# Patient Record
Sex: Male | Born: 1945 | Race: White | Hispanic: No | Marital: Married | Smoking: Never smoker
Health system: Southern US, Community
[De-identification: ages and names within clinical notes are randomized; demographics above are authoritative.]

## PROBLEM LIST (undated history)

## (undated) DIAGNOSIS — I10 Essential (primary) hypertension: Secondary | ICD-10-CM

## (undated) DIAGNOSIS — C434 Malignant melanoma of scalp and neck: Secondary | ICD-10-CM

## (undated) DIAGNOSIS — I639 Cerebral infarction, unspecified: Secondary | ICD-10-CM

## (undated) DIAGNOSIS — J45909 Unspecified asthma, uncomplicated: Secondary | ICD-10-CM

## (undated) DIAGNOSIS — E785 Hyperlipidemia, unspecified: Secondary | ICD-10-CM

## (undated) DIAGNOSIS — E119 Type 2 diabetes mellitus without complications: Secondary | ICD-10-CM

## (undated) DIAGNOSIS — E669 Obesity, unspecified: Secondary | ICD-10-CM

## (undated) HISTORY — PX: OTHER SURGICAL HISTORY: SHX169

## (undated) HISTORY — DX: Hyperlipidemia, unspecified: E78.5

## (undated) HISTORY — DX: Type 2 diabetes mellitus without complications: E11.9

## (undated) HISTORY — PX: CHOLECYSTECTOMY: SHX55

---

## 2004-01-11 ENCOUNTER — Ambulatory Visit: Payer: Self-pay | Admitting: Internal Medicine

## 2006-09-12 ENCOUNTER — Ambulatory Visit: Payer: Self-pay | Admitting: Internal Medicine

## 2012-01-23 ENCOUNTER — Encounter: Payer: Self-pay | Admitting: Nurse Practitioner

## 2012-01-23 ENCOUNTER — Encounter: Payer: Self-pay | Admitting: Cardiothoracic Surgery

## 2012-01-28 ENCOUNTER — Encounter: Payer: Self-pay | Admitting: Nurse Practitioner

## 2012-01-28 ENCOUNTER — Encounter: Payer: Self-pay | Admitting: Cardiothoracic Surgery

## 2012-07-15 ENCOUNTER — Ambulatory Visit: Payer: Self-pay | Admitting: Internal Medicine

## 2012-12-27 LAB — URINALYSIS, COMPLETE
Bilirubin,UR: NEGATIVE
Nitrite: NEGATIVE
Ph: 5 (ref 4.5–8.0)
RBC,UR: 4 /HPF (ref 0–5)
Squamous Epithelial: 3

## 2012-12-27 LAB — COMPREHENSIVE METABOLIC PANEL
Alkaline Phosphatase: 78 U/L (ref 50–136)
Calcium, Total: 8.9 mg/dL (ref 8.5–10.1)
Chloride: 105 mmol/L (ref 98–107)
Co2: 31 mmol/L (ref 21–32)
EGFR (African American): >60
EGFR (Non-African Amer.): >60
Glucose: 215 mg/dL — ABNORMAL HIGH (ref 65–99)
Osmolality: 286 (ref 275–301)
Potassium: 4.1 mmol/L (ref 3.5–5.1)
SGPT (ALT): 32 U/L (ref 12–78)
Sodium: 139 mmol/L (ref 136–145)
Total Protein: 6.7 g/dL (ref 6.4–8.2)

## 2012-12-27 LAB — CBC
HCT: 42.2 % (ref 40.0–52.0)
HGB: 14 g/dL (ref 13.0–18.0)
MCH: 31.2 pg (ref 26.0–34.0)
Platelet: 184 10*3/uL (ref 150–440)
RDW: 14.2 % (ref 11.5–14.5)
WBC: 8 10*3/uL (ref 3.8–10.6)

## 2012-12-28 ENCOUNTER — Inpatient Hospital Stay: Payer: Self-pay | Admitting: Internal Medicine

## 2012-12-30 LAB — CBC WITH DIFFERENTIAL/PLATELET
Basophil #: 0 10*3/uL (ref 0.0–0.1)
Basophil %: 0.5 %
Eosinophil %: 1.1 %
HCT: 42.3 % (ref 40.0–52.0)
HGB: 14.2 g/dL (ref 13.0–18.0)
Lymphocyte %: 21.9 %
MCH: 31.3 pg (ref 26.0–34.0)
MCHC: 33.6 g/dL (ref 32.0–36.0)
MCV: 93 fL (ref 80–100)
Monocyte #: 0.7 x10 3/mm (ref 0.2–1.0)
Monocyte %: 7.7 %
Neutrophil %: 68.8 %
Platelet: 179 10*3/uL (ref 150–440)
RBC: 4.53 10*6/uL (ref 4.40–5.90)
RDW: 13.7 % (ref 11.5–14.5)
WBC: 8.6 10*3/uL (ref 3.8–10.6)

## 2012-12-30 LAB — BASIC METABOLIC PANEL
BUN: 15 mg/dL (ref 7–18)
Chloride: 106 mmol/L (ref 98–107)
Creatinine: 0.83 mg/dL (ref 0.60–1.30)
EGFR (Non-African Amer.): >60
Glucose: 141 mg/dL — ABNORMAL HIGH (ref 65–99)
Osmolality: 279 (ref 275–301)
Potassium: 4 mmol/L (ref 3.5–5.1)
Sodium: 138 mmol/L (ref 136–145)

## 2012-12-30 LAB — HEMOGLOBIN A1C: Hemoglobin A1C: 9 % — ABNORMAL HIGH (ref 4.2–6.3)

## 2012-12-30 LAB — LIPID PANEL: HDL Cholesterol: 39 mg/dL — ABNORMAL LOW (ref 40–60)

## 2013-01-07 ENCOUNTER — Encounter: Payer: Self-pay | Admitting: Internal Medicine

## 2013-01-27 ENCOUNTER — Encounter: Payer: Self-pay | Admitting: Internal Medicine

## 2013-08-18 DIAGNOSIS — E669 Obesity, unspecified: Secondary | ICD-10-CM | POA: Insufficient documentation

## 2013-08-18 DIAGNOSIS — R0609 Other forms of dyspnea: Secondary | ICD-10-CM | POA: Insufficient documentation

## 2013-08-18 DIAGNOSIS — E119 Type 2 diabetes mellitus without complications: Secondary | ICD-10-CM | POA: Insufficient documentation

## 2013-08-18 DIAGNOSIS — E785 Hyperlipidemia, unspecified: Secondary | ICD-10-CM | POA: Insufficient documentation

## 2013-08-18 DIAGNOSIS — I10 Essential (primary) hypertension: Secondary | ICD-10-CM | POA: Insufficient documentation

## 2013-12-08 ENCOUNTER — Ambulatory Visit: Payer: Self-pay | Admitting: Gastroenterology

## 2013-12-10 LAB — PATHOLOGY REPORT

## 2014-03-03 DIAGNOSIS — E782 Mixed hyperlipidemia: Secondary | ICD-10-CM | POA: Diagnosis not present

## 2014-03-03 DIAGNOSIS — E1165 Type 2 diabetes mellitus with hyperglycemia: Secondary | ICD-10-CM | POA: Diagnosis not present

## 2014-03-03 DIAGNOSIS — I10 Essential (primary) hypertension: Secondary | ICD-10-CM | POA: Diagnosis not present

## 2014-03-03 DIAGNOSIS — Z79899 Other long term (current) drug therapy: Secondary | ICD-10-CM | POA: Diagnosis not present

## 2014-03-10 DIAGNOSIS — E1165 Type 2 diabetes mellitus with hyperglycemia: Secondary | ICD-10-CM | POA: Diagnosis not present

## 2014-03-10 DIAGNOSIS — I10 Essential (primary) hypertension: Secondary | ICD-10-CM | POA: Diagnosis not present

## 2014-03-10 DIAGNOSIS — E1142 Type 2 diabetes mellitus with diabetic polyneuropathy: Secondary | ICD-10-CM | POA: Diagnosis not present

## 2014-03-10 DIAGNOSIS — B351 Tinea unguium: Secondary | ICD-10-CM | POA: Diagnosis not present

## 2014-03-10 DIAGNOSIS — Z794 Long term (current) use of insulin: Secondary | ICD-10-CM | POA: Diagnosis not present

## 2014-05-23 DIAGNOSIS — J40 Bronchitis, not specified as acute or chronic: Secondary | ICD-10-CM | POA: Diagnosis not present

## 2014-05-23 DIAGNOSIS — R05 Cough: Secondary | ICD-10-CM | POA: Diagnosis not present

## 2014-05-23 DIAGNOSIS — J029 Acute pharyngitis, unspecified: Secondary | ICD-10-CM | POA: Diagnosis not present

## 2014-06-19 NOTE — H&P (Signed)
PATIENT NAME:  Philip Carpenter, Philip Carpenter MR#:  [id number] DATE OF BIRTH:  1945-08-31  DATE OF ADMISSION:  12/27/2012  PRIMARY CARE PHYSICIAN: Dr. Fulton Reek.   CHIEF COMPLAINT: Right-sided weakness and numbness.   HISTORY OF PRESENT ILLNESS: This is a 69 year old male who presents to the hospital due to some weakness in his right side and also some numbness. The patient said that he first developed some numbness of his right face and developed nasal congestion. He thought it was related to the infection that he may have caught from his granddaughter. He was out of town for work therefore, decided to come home early as he was not feeling well. He drove all the way back from [place], [place], got home and when he attempted to get out of the car, he was having difficulty walking and using his right leg. He said he had stopped on the way a couple times and had no trouble walking, but when he got home he had trouble walking. The patient then also developed some numbness of his right hand at lunch time today and had trouble grasping things. The patient was a bit concerned and therefore came to the ER for further evaluation. Presently, the patient still has some mild right arm numbness and facial numbness but the weakness in his lower extremities and the numbness have now resolved. He clinically feels much better. His CT head on admission in the ER, has been negative. There is still some concern for possible transient ischemic attack or underlying cerebrovascular accident and therefore, hospitalist services were contacted for further treatment and evaluation. The patient denies any headache. He denies any blurry vision. He denies any nausea, vomiting, chest pain, shortness of breath, diaphoresis, syncope, palpitations and no other associated symptoms presently.   REVIEW OF SYSTEMS: CONSTITUTIONAL: No documented fever. No weight gain, no weight loss.  EYES: No blurred or double vision.  ENT: No tinnitus. No postnasal  drip. No redness of the oropharynx.  RESPIRATORY: No cough, no wheeze, no hemoptysis, no dyspnea.  CARDIOVASCULAR: No chest pain. No orthopnea, no palpitations, no syncope.  GASTROINTESTINAL: No nausea, no vomiting, no diarrhea. No abdominal pain. No melena or hematochezia.  GENITOURINARY: No dysuria or hematuria.  ENDOCRINE: No polyuria or nocturia. No heat or cold intolerance.  HEMATOLOGIC: No anemia, no bruising, no bleeding.  INTEGUMENTARY: No rashes. No lesions.  MUSCULOSKELETAL: No arthritis. No swelling. No gout.  NEUROLOGIC: No numbness or tingling. No ataxia. No seizure-type activity. Positive right-sided numbness and weakness.  PSYCHIATRIC: No anxiety. No insomnia. No ADD.   PAST MEDICAL HISTORY: Consistent with diabetes, hypertension, hyperlipidemia, obesity.   ALLERGIES: No known drug allergies.   SOCIAL HISTORY: No smoking. No alcohol abuse. No illicit drug abuse. Lives at home with his wife.   FAMILY HISTORY: Both mother and father are deceased. Mother died from complications of dementia. Father died from complications of COPD.   CURRENT MEDICATIONS: As follows: Aspirin 81 mg daily, Altace 2.5 mg daily, Glucophage 1000 mg b.i.d., Glucotrol 10 mg b.i.d., Lantus 70 units at bedtime, NovoLog 20 units with lunch, 30 units with supper, Lipitor 10 mg daily and a multivitamin daily.   PHYSICAL EXAMINATION:  VITAL SIGNS:  Presently is as follows: Temperature 98.3, pulse 74, respirations 20, blood pressure 155/67, sats 93% on room air.  GENERAL: He is a pleasant-appearing male in no apparent distress.  HEAD, EYES, EARS, NOSE, THROAT EXAM: The patient is atraumatic, normocephalic. Extraocular muscles are intact. Pupils are equal and reactive to light. Sclerae  anicteric. No conjunctival injection. No pharyngeal erythema.  NECK: Supple. There is no jugular venous distention. No bruits, no lymphadenopathy, no thyromegaly.  HEART: Regular rate and rhythm. No murmurs. No rubs, no clicks.   LUNGS: Clear to auscultation bilaterally. No rales, no rhonchi, no wheezes.  ABDOMEN: Soft, flat, nontender, nondistended. Has good bowel sounds. No hepatosplenomegaly appreciated.  EXTREMITIES: No evidence of any cyanosis, clubbing, or peripheral edema. Has +2 pedal and radial pulses bilaterally.  NEUROLOGICAL: The patient is alert, awake, and oriented x 3 with no focal motor or sensory deficits appreciated bilaterally.  SKIN: Moist and warm with no rashes appreciated.  LYMPHATIC: There is no cervical or axillary lymphadenopathy.   LABORATORY AND IMAGING DATA: Serum glucose of 215, BUN 17, creatinine 1.1, sodium 139, potassium 4.1, chloride 105, bicarbonate 31. The patient's LFTs are within normal limits. White cell count 8.0, hemoglobin 14.0, hematocrit 42.2, platelet count 184. The patient did have a CT of the head done without contrast, which showed no acute intracranial abnormality, chronic ischemic change.   ASSESSMENT AND PLAN: This is a 69 year old male with a history of diabetes, hypertension, hyperlipidemia, obesity, who presents to the hospital due to right arm and leg numbness and weakness and also right facial numbness.   PROBLEM: 1.  Cerebrovascular accident/transient ischemic attack. This is a likely diagnosis given the patient's transient neurological symptoms which have now resolved. He does have significant risk factors given his diabetes and morbid obesity. A CT head on admission is negative, although I will get an MRI of the brain, get a carotid duplex and a 2-dimensional echocardiogram.  Continue him on aspirin. Continue his statin. Follow q. 4 hour neuro checks.  2.  Diabetes. I will continue his Lantus, NovoLog with meals and glipizide. Hold metformin for now.   3.  Hyperlipidemia: Continue atorvastatin. 4.  Hypertension. Continue Altace.  Tolerate some element of hypertension given the possibility of underlying stroke.   CODE STATUS: The patient is a FULL CODE.   He will  be transferred over to Dr. Fulton Reek service.   TIME SPENT: 45 minutes.     ____________________________ Belia Heman. Verdell Carmine, MD vjs:dp D: 12/27/2012 17:38:45 ET T: 12/27/2012 18:13:04 ET JOB#: [id number]  cc: Belia Heman. Verdell Carmine, MD, <Dictator> Henreitta Leber MD ELECTRONICALLY SIGNED 01/04/2013 10:28

## 2014-06-26 DIAGNOSIS — E1165 Type 2 diabetes mellitus with hyperglycemia: Secondary | ICD-10-CM | POA: Diagnosis not present

## 2014-07-02 DIAGNOSIS — E119 Type 2 diabetes mellitus without complications: Secondary | ICD-10-CM | POA: Diagnosis not present

## 2014-07-02 DIAGNOSIS — Z794 Long term (current) use of insulin: Secondary | ICD-10-CM | POA: Diagnosis not present

## 2014-07-02 DIAGNOSIS — E1165 Type 2 diabetes mellitus with hyperglycemia: Secondary | ICD-10-CM | POA: Diagnosis not present

## 2014-08-28 DIAGNOSIS — Z0000 Encounter for general adult medical examination without abnormal findings: Secondary | ICD-10-CM | POA: Diagnosis not present

## 2014-08-28 DIAGNOSIS — E782 Mixed hyperlipidemia: Secondary | ICD-10-CM | POA: Diagnosis not present

## 2014-08-28 DIAGNOSIS — E119 Type 2 diabetes mellitus without complications: Secondary | ICD-10-CM | POA: Diagnosis not present

## 2014-08-28 DIAGNOSIS — I10 Essential (primary) hypertension: Secondary | ICD-10-CM | POA: Diagnosis not present

## 2014-10-09 DIAGNOSIS — Z125 Encounter for screening for malignant neoplasm of prostate: Secondary | ICD-10-CM | POA: Diagnosis not present

## 2014-10-09 DIAGNOSIS — E782 Mixed hyperlipidemia: Secondary | ICD-10-CM | POA: Diagnosis not present

## 2014-10-09 DIAGNOSIS — Z79899 Other long term (current) drug therapy: Secondary | ICD-10-CM | POA: Diagnosis not present

## 2014-10-09 DIAGNOSIS — E119 Type 2 diabetes mellitus without complications: Secondary | ICD-10-CM | POA: Diagnosis not present

## 2014-10-09 DIAGNOSIS — E1165 Type 2 diabetes mellitus with hyperglycemia: Secondary | ICD-10-CM | POA: Diagnosis not present

## 2014-10-09 DIAGNOSIS — I10 Essential (primary) hypertension: Secondary | ICD-10-CM | POA: Diagnosis not present

## 2014-10-14 DIAGNOSIS — E119 Type 2 diabetes mellitus without complications: Secondary | ICD-10-CM | POA: Diagnosis not present

## 2014-10-14 DIAGNOSIS — E1165 Type 2 diabetes mellitus with hyperglycemia: Secondary | ICD-10-CM | POA: Diagnosis not present

## 2014-10-14 DIAGNOSIS — Z794 Long term (current) use of insulin: Secondary | ICD-10-CM | POA: Diagnosis not present

## 2015-01-15 DIAGNOSIS — E1165 Type 2 diabetes mellitus with hyperglycemia: Secondary | ICD-10-CM | POA: Diagnosis not present

## 2015-01-19 DIAGNOSIS — Z794 Long term (current) use of insulin: Secondary | ICD-10-CM | POA: Diagnosis not present

## 2015-01-19 DIAGNOSIS — I10 Essential (primary) hypertension: Secondary | ICD-10-CM | POA: Diagnosis not present

## 2015-01-19 DIAGNOSIS — E1165 Type 2 diabetes mellitus with hyperglycemia: Secondary | ICD-10-CM | POA: Diagnosis not present

## 2015-03-03 DIAGNOSIS — Z6841 Body Mass Index (BMI) 40.0 and over, adult: Secondary | ICD-10-CM | POA: Diagnosis not present

## 2015-03-03 DIAGNOSIS — Z79899 Other long term (current) drug therapy: Secondary | ICD-10-CM | POA: Diagnosis not present

## 2015-03-03 DIAGNOSIS — I10 Essential (primary) hypertension: Secondary | ICD-10-CM | POA: Diagnosis not present

## 2015-03-03 DIAGNOSIS — E782 Mixed hyperlipidemia: Secondary | ICD-10-CM | POA: Diagnosis not present

## 2015-03-11 DIAGNOSIS — E1142 Type 2 diabetes mellitus with diabetic polyneuropathy: Secondary | ICD-10-CM | POA: Diagnosis not present

## 2015-03-11 DIAGNOSIS — B351 Tinea unguium: Secondary | ICD-10-CM | POA: Diagnosis not present

## 2015-04-20 DIAGNOSIS — Z79899 Other long term (current) drug therapy: Secondary | ICD-10-CM | POA: Diagnosis not present

## 2015-04-20 DIAGNOSIS — E782 Mixed hyperlipidemia: Secondary | ICD-10-CM | POA: Diagnosis not present

## 2015-04-20 DIAGNOSIS — Z794 Long term (current) use of insulin: Secondary | ICD-10-CM | POA: Diagnosis not present

## 2015-04-20 DIAGNOSIS — E1165 Type 2 diabetes mellitus with hyperglycemia: Secondary | ICD-10-CM | POA: Diagnosis not present

## 2015-04-27 DIAGNOSIS — Z794 Long term (current) use of insulin: Secondary | ICD-10-CM

## 2015-04-27 DIAGNOSIS — Z6841 Body Mass Index (BMI) 40.0 and over, adult: Secondary | ICD-10-CM

## 2015-04-27 DIAGNOSIS — E1165 Type 2 diabetes mellitus with hyperglycemia: Secondary | ICD-10-CM | POA: Insufficient documentation

## 2015-04-27 DIAGNOSIS — I10 Essential (primary) hypertension: Secondary | ICD-10-CM | POA: Diagnosis not present

## 2015-08-16 DIAGNOSIS — Z794 Long term (current) use of insulin: Secondary | ICD-10-CM | POA: Diagnosis not present

## 2015-08-16 DIAGNOSIS — E1165 Type 2 diabetes mellitus with hyperglycemia: Secondary | ICD-10-CM | POA: Diagnosis not present

## 2015-08-17 DIAGNOSIS — Z794 Long term (current) use of insulin: Secondary | ICD-10-CM | POA: Diagnosis not present

## 2015-08-17 DIAGNOSIS — E1165 Type 2 diabetes mellitus with hyperglycemia: Secondary | ICD-10-CM | POA: Diagnosis not present

## 2015-08-17 DIAGNOSIS — E119 Type 2 diabetes mellitus without complications: Secondary | ICD-10-CM | POA: Diagnosis not present

## 2015-08-17 DIAGNOSIS — I10 Essential (primary) hypertension: Secondary | ICD-10-CM | POA: Diagnosis not present

## 2015-08-30 DIAGNOSIS — Z862 Personal history of diseases of the blood and blood-forming organs and certain disorders involving the immune mechanism: Secondary | ICD-10-CM | POA: Diagnosis not present

## 2015-08-30 DIAGNOSIS — E785 Hyperlipidemia, unspecified: Secondary | ICD-10-CM | POA: Diagnosis not present

## 2015-09-01 DIAGNOSIS — E113393 Type 2 diabetes mellitus with moderate nonproliferative diabetic retinopathy without macular edema, bilateral: Secondary | ICD-10-CM | POA: Diagnosis not present

## 2015-09-01 DIAGNOSIS — H5210 Myopia, unspecified eye: Secondary | ICD-10-CM | POA: Diagnosis not present

## 2015-09-01 DIAGNOSIS — H524 Presbyopia: Secondary | ICD-10-CM | POA: Diagnosis not present

## 2015-09-06 DIAGNOSIS — E785 Hyperlipidemia, unspecified: Secondary | ICD-10-CM | POA: Diagnosis not present

## 2015-09-06 DIAGNOSIS — E1142 Type 2 diabetes mellitus with diabetic polyneuropathy: Secondary | ICD-10-CM | POA: Diagnosis not present

## 2015-09-06 DIAGNOSIS — Z0000 Encounter for general adult medical examination without abnormal findings: Secondary | ICD-10-CM | POA: Diagnosis not present

## 2015-09-06 DIAGNOSIS — R0609 Other forms of dyspnea: Secondary | ICD-10-CM | POA: Diagnosis not present

## 2015-09-06 DIAGNOSIS — Z125 Encounter for screening for malignant neoplasm of prostate: Secondary | ICD-10-CM | POA: Diagnosis not present

## 2015-09-06 DIAGNOSIS — I10 Essential (primary) hypertension: Secondary | ICD-10-CM | POA: Diagnosis not present

## 2015-09-06 DIAGNOSIS — Z8673 Personal history of transient ischemic attack (TIA), and cerebral infarction without residual deficits: Secondary | ICD-10-CM | POA: Diagnosis not present

## 2015-09-06 DIAGNOSIS — R37 Sexual dysfunction, unspecified: Secondary | ICD-10-CM | POA: Diagnosis not present

## 2015-09-17 DIAGNOSIS — Z8673 Personal history of transient ischemic attack (TIA), and cerebral infarction without residual deficits: Secondary | ICD-10-CM | POA: Diagnosis not present

## 2015-09-17 DIAGNOSIS — R0609 Other forms of dyspnea: Secondary | ICD-10-CM | POA: Diagnosis not present

## 2015-11-10 DIAGNOSIS — Z794 Long term (current) use of insulin: Secondary | ICD-10-CM | POA: Diagnosis not present

## 2015-11-10 DIAGNOSIS — R37 Sexual dysfunction, unspecified: Secondary | ICD-10-CM | POA: Diagnosis not present

## 2015-11-10 DIAGNOSIS — Z125 Encounter for screening for malignant neoplasm of prostate: Secondary | ICD-10-CM | POA: Diagnosis not present

## 2015-11-10 DIAGNOSIS — E119 Type 2 diabetes mellitus without complications: Secondary | ICD-10-CM | POA: Diagnosis not present

## 2015-11-17 DIAGNOSIS — Z794 Long term (current) use of insulin: Secondary | ICD-10-CM | POA: Diagnosis not present

## 2015-11-17 DIAGNOSIS — I10 Essential (primary) hypertension: Secondary | ICD-10-CM | POA: Diagnosis not present

## 2015-11-17 DIAGNOSIS — E119 Type 2 diabetes mellitus without complications: Secondary | ICD-10-CM | POA: Diagnosis not present

## 2015-12-13 DIAGNOSIS — E349 Endocrine disorder, unspecified: Secondary | ICD-10-CM | POA: Diagnosis not present

## 2015-12-21 DIAGNOSIS — E291 Testicular hypofunction: Secondary | ICD-10-CM | POA: Diagnosis not present

## 2015-12-21 DIAGNOSIS — Z125 Encounter for screening for malignant neoplasm of prostate: Secondary | ICD-10-CM | POA: Diagnosis not present

## 2015-12-21 DIAGNOSIS — I10 Essential (primary) hypertension: Secondary | ICD-10-CM | POA: Diagnosis not present

## 2016-01-25 ENCOUNTER — Ambulatory Visit (INDEPENDENT_AMBULATORY_CARE_PROVIDER_SITE_OTHER): Payer: Commercial Managed Care - HMO | Admitting: Urology

## 2016-01-25 ENCOUNTER — Encounter: Payer: Self-pay | Admitting: Urology

## 2016-01-25 VITALS — BP 147/64 | HR 69 | Ht 69.0 in | Wt 314.7 lb

## 2016-01-25 DIAGNOSIS — E291 Testicular hypofunction: Secondary | ICD-10-CM | POA: Insufficient documentation

## 2016-01-25 DIAGNOSIS — R350 Frequency of micturition: Secondary | ICD-10-CM

## 2016-01-25 DIAGNOSIS — N401 Enlarged prostate with lower urinary tract symptoms: Secondary | ICD-10-CM | POA: Insufficient documentation

## 2016-01-25 DIAGNOSIS — Z125 Encounter for screening for malignant neoplasm of prostate: Secondary | ICD-10-CM | POA: Insufficient documentation

## 2016-01-25 MED ORDER — FINASTERIDE 5 MG PO TABS: 5.0000 mg | ORAL_TABLET | Freq: Every day | ORAL | 3 refills | Status: DC

## 2016-01-25 MED ORDER — TAMSULOSIN HCL 0.4 MG PO CAPS: 0.4000 mg | ORAL_CAPSULE | Freq: Every day | ORAL | 3 refills | Status: DC

## 2016-01-25 NOTE — Progress Notes (Signed)
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 He is morbidly obese diabetic and with clinical CHF with dyspnea on exertion. After consideration of risks and benefits he has decided to forego additional testing or supplementation.     2. Prostate cancer screening - No FHX prostate cancer 2017 - PSA 0.91 PCP labs / DRE 50gm smooth at age 70 ==> No further screening  3 - Erectile Dysfunction- pt with severe ED x years, unable to penetrate in over 10 years even with oral meds. He now is considering more aggressive hterapy. He is morbidly obese with large panus and cannot see his own penis which is buried in prepubic fat pad. He is considering possible trimix trial administered by his wife if she is amenable.  4 - Enlarged Prostate With Urinary Freqeuncy - long h/o irritative and obstructive symptoms improved on tamsulosin but still not at goal. DRE 50g smooth. Starting finasteride as well 2017.  PMH sig for morbid obesity, IDDM2 (A1c 9's).  His PCP is Fulton Reek MD with [facility].  Today "Philip Carpenter" is seen as new patient for above.    PMH: No past medical history on file.  Surgical History: No past surgical history on file.  Home Medications:    Medication List    as of 01/25/2016 10:31 AM   You have not been prescribed any medications.     Allergies: Allergies not on file  Family History: No family history on file.  Social History:  has no tobacco, alcohol, and drug history on file.    Review of Systems  Gastrointestinal (upper)  : Negative for upper GI symptoms  Gastrointestinal (lower) : Negative for lower GI symptoms  Constitutional : Negative for symptoms  Skin: Negative for  skin symptoms  Eyes: Negative for eye symptoms  Ear/Nose/Throat : Negative for Ear/Nose/Throat symptoms  Hematologic/Lymphatic: Negative for Hematologic/Lymphatic symptoms  Cardiovascular : Negative for cardiovascular symptoms  Respiratory : Negative for respiratory symptoms  Endocrine: Negative for endocrine symptoms  Musculoskeletal: Negative for musculoskeletal symptoms  Neurological: Negative for neurological symptoms  Psychologic: Negative for psychiatric symptoms    Physical Exam: There were no vitals taken for this visit.  Constitutional:  Alert and oriented, No acute distress. HEENT: [place] AT, moist mucus membranes.  Trachea midline, no masses. Cardiovascular: No clubbing, cyanosis, or edema. Respiratory: Normal respiratory effort, no increased work of breathing. GI: Abdomen is soft, nontender, nondistended, no abdominal masses. Massive truncal obesity.  GU: No CVA tenderness. Testes down w/o masses. 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 DRE 50gm smooth.  Skin: No rashes, bruises or suspicious lesions. Lymph: No cervical or inguinal adenopathy. Neurologic: Grossly intact, no focal deficits, moving all 4 extremities. Psychiatric: Normal mood and affect.  Laboratory Data: Lab Results  Component Value Date   WBC 8.6 12/30/2012   HGB 14.2 12/30/2012   HCT 42.3 12/30/2012   MCV 93 12/30/2012   PLT 179 12/30/2012    Lab Results  Component Value Date   CREATININE 0.83 12/30/2012    No results found for: PSA  No results found for: TESTOSTERONE  Lab Results  Component Value Date   HGBA1C 9.0 (H) 12/30/2012    Urinalysis    Component Value Date/Time  COLORURINE Yellow 12/27/2012 1636   APPEARANCEUR Hazy 12/27/2012 1636   LABSPEC 1.036 12/27/2012 1636   PHURINE 5.0 12/27/2012 1636   GLUCOSEU 150 mg/dL 12/27/2012 1636   HGBUR Negative 12/27/2012 1636   BILIRUBINUR Negative 12/27/2012 1636   KETONESUR Trace 12/27/2012 1636   PROTEINUR Negative 12/27/2012  1636   NITRITE Negative 12/27/2012 1636   LEUKOCYTESUR Trace 12/27/2012 1636    Pertinent Imaging: none  Assessment & Plan:  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 Also discussed that his obesity lowers tesosteorne by periperal conversion to estradiol via aromatase in excess fat that weight loss alone is safest way to increase testosterone.  Should he consider exogenous androgens would need baseline endocrine eval with AM fasting labs x 2 (before 10AM) with CMP, Hgb/Hct, PSA, T, Estradiol, PRL, LH, FSH and then total T again 1 week later and rediscuss.   He does not want furhter testing and I agree completely.   2. Prostate cancer screening - up to date this year and at age 65 with diabetes would not recommend furhter screening.  3. Erectile Dysfunction - discussed this is another manifestation of vascular disease and diabetes. He is refracotry to oral meds. Discussed that injection meds (trimix) may get him firm enough to penetrate but he would need his wife to administer as he cannot see his penis. He will talk it over with her and let us know if the wants med trial wit hin ofice teachin of his wife.  4 - Enlarged Prostate With Urinary Urgency - discssed role of additional medical therapy and he opts to add finasteride. I agree as this is cheap and safe. Discussed time course to improvement.  5 - RTC 1 year or sooner for trimix teaching.    Alexis Frock, [facility] [address], [place], [place] [address] [phone]

## 2016-02-09 DIAGNOSIS — E119 Type 2 diabetes mellitus without complications: Secondary | ICD-10-CM | POA: Diagnosis not present

## 2016-02-09 DIAGNOSIS — Z794 Long term (current) use of insulin: Secondary | ICD-10-CM | POA: Diagnosis not present

## 2016-02-16 DIAGNOSIS — Z794 Long term (current) use of insulin: Secondary | ICD-10-CM | POA: Diagnosis not present

## 2016-02-16 DIAGNOSIS — I10 Essential (primary) hypertension: Secondary | ICD-10-CM | POA: Diagnosis not present

## 2016-02-16 DIAGNOSIS — E119 Type 2 diabetes mellitus without complications: Secondary | ICD-10-CM | POA: Diagnosis not present

## 2016-03-03 DIAGNOSIS — E113393 Type 2 diabetes mellitus with moderate nonproliferative diabetic retinopathy without macular edema, bilateral: Secondary | ICD-10-CM | POA: Diagnosis not present

## 2016-03-14 DIAGNOSIS — Z125 Encounter for screening for malignant neoplasm of prostate: Secondary | ICD-10-CM | POA: Diagnosis not present

## 2016-03-14 DIAGNOSIS — E291 Testicular hypofunction: Secondary | ICD-10-CM | POA: Diagnosis not present

## 2016-03-21 DIAGNOSIS — I10 Essential (primary) hypertension: Secondary | ICD-10-CM | POA: Diagnosis not present

## 2016-03-21 DIAGNOSIS — Z6841 Body Mass Index (BMI) 40.0 and over, adult: Secondary | ICD-10-CM | POA: Diagnosis not present

## 2016-03-21 DIAGNOSIS — E785 Hyperlipidemia, unspecified: Secondary | ICD-10-CM | POA: Diagnosis not present

## 2016-03-21 DIAGNOSIS — Z79899 Other long term (current) drug therapy: Secondary | ICD-10-CM | POA: Diagnosis not present

## 2016-04-18 DIAGNOSIS — E785 Hyperlipidemia, unspecified: Secondary | ICD-10-CM | POA: Diagnosis not present

## 2016-04-18 DIAGNOSIS — Z794 Long term (current) use of insulin: Secondary | ICD-10-CM | POA: Diagnosis not present

## 2016-04-18 DIAGNOSIS — E119 Type 2 diabetes mellitus without complications: Secondary | ICD-10-CM | POA: Diagnosis not present

## 2016-04-18 DIAGNOSIS — Z79899 Other long term (current) drug therapy: Secondary | ICD-10-CM | POA: Diagnosis not present

## 2016-04-25 DIAGNOSIS — Z794 Long term (current) use of insulin: Secondary | ICD-10-CM | POA: Diagnosis not present

## 2016-04-25 DIAGNOSIS — E1165 Type 2 diabetes mellitus with hyperglycemia: Secondary | ICD-10-CM | POA: Diagnosis not present

## 2016-04-25 DIAGNOSIS — Z91120 Patient's intentional underdosing of medication regimen due to financial hardship: Secondary | ICD-10-CM | POA: Diagnosis not present

## 2016-08-23 DIAGNOSIS — R300 Dysuria: Secondary | ICD-10-CM | POA: Diagnosis not present

## 2016-08-23 DIAGNOSIS — N390 Urinary tract infection, site not specified: Secondary | ICD-10-CM | POA: Diagnosis not present

## 2016-09-07 DIAGNOSIS — H524 Presbyopia: Secondary | ICD-10-CM | POA: Diagnosis not present

## 2016-09-07 DIAGNOSIS — E113393 Type 2 diabetes mellitus with moderate nonproliferative diabetic retinopathy without macular edema, bilateral: Secondary | ICD-10-CM | POA: Diagnosis not present

## 2016-09-19 DIAGNOSIS — Z6841 Body Mass Index (BMI) 40.0 and over, adult: Secondary | ICD-10-CM | POA: Diagnosis not present

## 2016-09-19 DIAGNOSIS — I10 Essential (primary) hypertension: Secondary | ICD-10-CM | POA: Diagnosis not present

## 2016-09-19 DIAGNOSIS — E785 Hyperlipidemia, unspecified: Secondary | ICD-10-CM | POA: Diagnosis not present

## 2016-09-19 DIAGNOSIS — E1165 Type 2 diabetes mellitus with hyperglycemia: Secondary | ICD-10-CM | POA: Diagnosis not present

## 2016-09-19 DIAGNOSIS — Z79899 Other long term (current) drug therapy: Secondary | ICD-10-CM | POA: Diagnosis not present

## 2016-09-19 DIAGNOSIS — Z794 Long term (current) use of insulin: Secondary | ICD-10-CM | POA: Diagnosis not present

## 2016-09-19 DIAGNOSIS — Z0000 Encounter for general adult medical examination without abnormal findings: Secondary | ICD-10-CM | POA: Diagnosis not present

## 2016-11-17 DIAGNOSIS — Z23 Encounter for immunization: Secondary | ICD-10-CM | POA: Diagnosis not present

## 2016-12-21 DIAGNOSIS — E1165 Type 2 diabetes mellitus with hyperglycemia: Secondary | ICD-10-CM | POA: Diagnosis not present

## 2016-12-21 DIAGNOSIS — Z794 Long term (current) use of insulin: Secondary | ICD-10-CM | POA: Diagnosis not present

## 2016-12-21 DIAGNOSIS — Z91120 Patient's intentional underdosing of medication regimen due to financial hardship: Secondary | ICD-10-CM | POA: Diagnosis not present

## 2017-01-23 ENCOUNTER — Encounter: Payer: Self-pay | Admitting: Urology

## 2017-01-23 ENCOUNTER — Ambulatory Visit: Payer: Medicare HMO | Admitting: Urology

## 2017-01-23 VITALS — BP 146/77 | HR 89 | Ht 69.0 in | Wt 285.0 lb

## 2017-01-23 DIAGNOSIS — R350 Frequency of micturition: Secondary | ICD-10-CM

## 2017-01-23 DIAGNOSIS — N401 Enlarged prostate with lower urinary tract symptoms: Secondary | ICD-10-CM

## 2017-01-23 MED ORDER — FINASTERIDE 5 MG PO TABS: 5.0000 mg | ORAL_TABLET | Freq: Every day | ORAL | 3 refills | Status: AC

## 2017-01-23 NOTE — Progress Notes (Signed)
01/23/2017 2:25 PM   Philip Carpenter Sep 23, 1945 [id number]  Referring provider: Idelle Crouch, MD [facility] [place], [place] [address]  Chief Complaint  Patient presents with  . Follow-up  . Benign Prostatic Hypertrophy    HPI: 71 year old male presents for annual follow-up.  He saw Dr. Tresa Moore last year for BPH with lower urinary tract symptoms, possible hypogonadism and erectile dysfunction.  He had been on tamsulosin and finasteride was added.  He elected not to pursue androgen replacement.  He was considering intracavernosal injections for ED but elected to pursue. He states with the addition of finasteride he has noted less nocturia and less nighttime urgency. He currently gets up 1-2 times per night to void.  He feels his daytime urgency has worsened.  He does note onset of urgency with occasional episodes of urge incontinence when he goes from a sitting/supine position to standing.  He has done well at minimizing the symptoms with timed voiding.  He did have a PSA with Dr. Doy Hutching January 2018 which was 0.44.  He denies dysuria or gross hematuria.  He denies flank, abdominal, pelvic or scrotal pain.   PMH: Past Medical History:  Diagnosis Date  . Diabetes mellitus without complication ([place])   . Hyperlipidemia     Surgical History: History reviewed. No pertinent surgical history.  Home Medications:  Allergies as of 01/23/2017   No Known Allergies     Medication List        Accurate as of 01/23/17  2:25 PM. Always use your most recent med list.          atorvastatin 10 MG tablet Commonly known as:  LIPITOR Take by mouth.   finasteride 5 MG tablet Commonly known as:  PROSCAR Take 1 tablet (5 mg total) by mouth daily.   GOODSENSE ASPIRIN 325 MG tablet Generic drug:  aspirin Take by mouth.   insulin aspart 100 UNIT/ML FlexPen Commonly known as:  NOVOLOG Take before meals: 10 units at breakfast, 22 units at lunch and 32  units at supper   Insulin Degludec 200 UNIT/ML Sopn Inject into the skin.   TRESIBA FLEXTOUCH 200 UNIT/ML Sopn Generic drug:  Insulin Degludec Inject into the skin.   metFORMIN 1000 MG tablet Commonly known as:  GLUCOPHAGE TAKE 1 TABLET TWICE DAILY WITH MEALS   MULTI-VITAMINS Tabs Take by mouth.   ramipril 2.5 MG capsule Commonly known as:  ALTACE Take by mouth.   tamsulosin 0.4 MG Caps capsule Commonly known as:  FLOMAX Take 1 capsule (0.4 mg total) by mouth daily.       Allergies: No Known Allergies  Family History: Family History  Problem Relation Age of Onset  . Prostate cancer Neg Hx   . Bladder Cancer Neg Hx   . Kidney cancer Neg Hx     Social History:  reports that  has never smoked. he has never used smokeless tobacco. He reports that he does not drink alcohol or use drugs.  ROS: UROLOGY Frequent Urination?: No Hard to postpone urination?: Yes Burning/pain with urination?: No Get up at night to urinate?: No Leakage of urine?: Yes Urine stream starts and stops?: No Trouble starting stream?: No Do you have to strain to urinate?: No Blood in urine?: No Urinary tract infection?: No Sexually transmitted disease?: No Injury to kidneys or bladder?: No Painful intercourse?: No Weak stream?: No Erection problems?: Yes Penile pain?: No  Gastrointestinal Nausea?: No Vomiting?: No Indigestion/heartburn?: No Diarrhea?: No  Constipation?: No  Constitutional Fever: No Night sweats?: No Weight loss?: No Fatigue?: No  Skin Skin rash/lesions?: No Itching?: No  Eyes Blurred vision?: No Double vision?: No  Ears/Nose/Throat Sore throat?: No Sinus problems?: Yes  Hematologic/Lymphatic Swollen glands?: No Easy bruising?: No  Cardiovascular Leg swelling?: No Chest pain?: No  Respiratory Cough?: No Shortness of breath?: Yes  Endocrine Excessive thirst?: No  Musculoskeletal Back pain?: No Joint pain?: No  Neurological Headaches?:  No Dizziness?: Yes  Psychologic Depression?: No Anxiety?: No  Physical Exam: BP (!) 146/77   Pulse 89   Ht 5\' 9"  (1.753 m)   Wt 285 lb (129.3 kg)   BMI 42.09 kg/m   Constitutional:  Alert and oriented, No acute distress. HEENT: [place] AT, moist mucus membranes.  Trachea midline, no masses. Cardiovascular: No clubbing, cyanosis, or edema. Respiratory: Normal respiratory effort, no increased work of breathing. GI: Abdomen is soft, nontender, nondistended, no abdominal masses GU: No CVA tenderness.  Prostate 40 g, smooth without nodules Skin: No rashes, bruises or suspicious lesions. Lymph: No cervical or inguinal adenopathy. Neurologic: Grossly intact, no focal deficits, moving all 4 extremities. Psychiatric: Normal mood and affect.  Laboratory Data: Lab Results  Component Value Date   WBC 8.6 12/30/2012   HGB 14.2 12/30/2012   HCT 42.3 12/30/2012   MCV 93 12/30/2012   PLT 179 12/30/2012    Lab Results  Component Value Date   CREATININE 0.83 12/30/2012    Lab Results  Component Value Date   HGBA1C 9.0 (H) 12/30/2012      Assessment & Plan:    1.  BPH with lower urinary tract symptoms Worsening daytime storage related symptoms.  I did discuss addition of Myrbetriq however he is currently doing well with timed voiding and does not desire to take additional medication.  He has been on finasteride for 1 year.  I did recommend he try stopping the tamsulosin and may restart should he have worsening LUTS.  Continue annual follow-up.  Finasteride was refilled  2.  Prostate cancer screening We discussed current AUA guidelines recommending PSAs between the ages of 65 and 37.  His PSA has been low and the chances of developing clinically significant prostate cancer are unlikely.   Abbie Sons, [place] [address], [place], [place] [address] [phone]

## 2017-01-24 DIAGNOSIS — E113393 Type 2 diabetes mellitus with moderate nonproliferative diabetic retinopathy without macular edema, bilateral: Secondary | ICD-10-CM | POA: Diagnosis not present

## 2017-03-23 DIAGNOSIS — I10 Essential (primary) hypertension: Secondary | ICD-10-CM | POA: Diagnosis not present

## 2017-03-23 DIAGNOSIS — Z794 Long term (current) use of insulin: Secondary | ICD-10-CM | POA: Diagnosis not present

## 2017-03-23 DIAGNOSIS — E1165 Type 2 diabetes mellitus with hyperglycemia: Secondary | ICD-10-CM | POA: Diagnosis not present

## 2017-03-23 DIAGNOSIS — Z9114 Patient's other noncompliance with medication regimen: Secondary | ICD-10-CM | POA: Diagnosis not present

## 2017-04-12 DIAGNOSIS — Z125 Encounter for screening for malignant neoplasm of prostate: Secondary | ICD-10-CM | POA: Diagnosis not present

## 2017-04-12 DIAGNOSIS — I10 Essential (primary) hypertension: Secondary | ICD-10-CM | POA: Diagnosis not present

## 2017-04-12 DIAGNOSIS — Z79899 Other long term (current) drug therapy: Secondary | ICD-10-CM | POA: Diagnosis not present

## 2017-04-12 DIAGNOSIS — Z6841 Body Mass Index (BMI) 40.0 and over, adult: Secondary | ICD-10-CM | POA: Diagnosis not present

## 2017-04-12 DIAGNOSIS — E1142 Type 2 diabetes mellitus with diabetic polyneuropathy: Secondary | ICD-10-CM | POA: Diagnosis not present

## 2017-04-12 DIAGNOSIS — E785 Hyperlipidemia, unspecified: Secondary | ICD-10-CM | POA: Diagnosis not present

## 2017-04-16 DIAGNOSIS — Z794 Long term (current) use of insulin: Secondary | ICD-10-CM | POA: Diagnosis not present

## 2017-04-16 DIAGNOSIS — Z9114 Patient's other noncompliance with medication regimen: Secondary | ICD-10-CM | POA: Diagnosis not present

## 2017-04-16 DIAGNOSIS — E1165 Type 2 diabetes mellitus with hyperglycemia: Secondary | ICD-10-CM | POA: Diagnosis not present

## 2017-06-29 DIAGNOSIS — Z794 Long term (current) use of insulin: Secondary | ICD-10-CM | POA: Diagnosis not present

## 2017-06-29 DIAGNOSIS — Z9114 Patient's other noncompliance with medication regimen: Secondary | ICD-10-CM | POA: Diagnosis not present

## 2017-06-29 DIAGNOSIS — E1165 Type 2 diabetes mellitus with hyperglycemia: Secondary | ICD-10-CM | POA: Diagnosis not present

## 2017-07-26 DIAGNOSIS — M47816 Spondylosis without myelopathy or radiculopathy, lumbar region: Secondary | ICD-10-CM | POA: Diagnosis not present

## 2017-07-26 DIAGNOSIS — Z6841 Body Mass Index (BMI) 40.0 and over, adult: Secondary | ICD-10-CM | POA: Diagnosis not present

## 2017-07-26 DIAGNOSIS — M48061 Spinal stenosis, lumbar region without neurogenic claudication: Secondary | ICD-10-CM | POA: Diagnosis not present

## 2017-07-26 DIAGNOSIS — M5489 Other dorsalgia: Secondary | ICD-10-CM | POA: Diagnosis not present

## 2017-07-30 ENCOUNTER — Other Ambulatory Visit: Payer: Self-pay | Admitting: Internal Medicine

## 2017-07-30 DIAGNOSIS — M5489 Other dorsalgia: Secondary | ICD-10-CM

## 2017-07-31 ENCOUNTER — Other Ambulatory Visit: Payer: Self-pay | Admitting: Internal Medicine

## 2017-07-31 DIAGNOSIS — M5489 Other dorsalgia: Secondary | ICD-10-CM

## 2017-08-15 ENCOUNTER — Ambulatory Visit: Admission: RE | Admit: 2017-08-15 | Payer: Medicare HMO | Source: Ambulatory Visit

## 2017-10-02 DIAGNOSIS — E1165 Type 2 diabetes mellitus with hyperglycemia: Secondary | ICD-10-CM | POA: Diagnosis not present

## 2017-10-08 DIAGNOSIS — E1165 Type 2 diabetes mellitus with hyperglycemia: Secondary | ICD-10-CM | POA: Diagnosis not present

## 2017-12-06 DIAGNOSIS — I10 Essential (primary) hypertension: Secondary | ICD-10-CM | POA: Diagnosis not present

## 2017-12-06 DIAGNOSIS — Z6841 Body Mass Index (BMI) 40.0 and over, adult: Secondary | ICD-10-CM | POA: Diagnosis not present

## 2017-12-06 DIAGNOSIS — E785 Hyperlipidemia, unspecified: Secondary | ICD-10-CM | POA: Diagnosis not present

## 2017-12-06 DIAGNOSIS — Z23 Encounter for immunization: Secondary | ICD-10-CM | POA: Diagnosis not present

## 2017-12-06 DIAGNOSIS — E1165 Type 2 diabetes mellitus with hyperglycemia: Secondary | ICD-10-CM | POA: Diagnosis not present

## 2017-12-06 DIAGNOSIS — Z794 Long term (current) use of insulin: Secondary | ICD-10-CM | POA: Diagnosis not present

## 2017-12-06 DIAGNOSIS — Z79899 Other long term (current) drug therapy: Secondary | ICD-10-CM | POA: Diagnosis not present

## 2017-12-06 DIAGNOSIS — R55 Syncope and collapse: Secondary | ICD-10-CM | POA: Diagnosis not present

## 2017-12-07 DIAGNOSIS — Z86010 Personal history of colonic polyps: Secondary | ICD-10-CM | POA: Diagnosis not present

## 2017-12-10 ENCOUNTER — Other Ambulatory Visit: Payer: Self-pay | Admitting: Internal Medicine

## 2017-12-10 DIAGNOSIS — R55 Syncope and collapse: Secondary | ICD-10-CM

## 2017-12-24 ENCOUNTER — Ambulatory Visit
Admission: RE | Admit: 2017-12-24 | Discharge: 2017-12-24 | Disposition: A | Discharge status: Home / Self Care | Payer: Medicare HMO | Source: Ambulatory Visit | Attending: Internal Medicine | Admitting: Internal Medicine

## 2017-12-24 DIAGNOSIS — S0990XA Unspecified injury of head, initial encounter: Secondary | ICD-10-CM | POA: Diagnosis not present

## 2017-12-24 DIAGNOSIS — R55 Syncope and collapse: Secondary | ICD-10-CM | POA: Diagnosis not present

## 2018-01-11 DIAGNOSIS — Z79899 Other long term (current) drug therapy: Secondary | ICD-10-CM | POA: Diagnosis not present

## 2018-01-11 DIAGNOSIS — E1165 Type 2 diabetes mellitus with hyperglycemia: Secondary | ICD-10-CM | POA: Diagnosis not present

## 2018-01-14 DIAGNOSIS — E1169 Type 2 diabetes mellitus with other specified complication: Secondary | ICD-10-CM | POA: Diagnosis not present

## 2018-01-14 DIAGNOSIS — E669 Obesity, unspecified: Secondary | ICD-10-CM | POA: Diagnosis not present

## 2018-01-14 DIAGNOSIS — E1165 Type 2 diabetes mellitus with hyperglycemia: Secondary | ICD-10-CM | POA: Diagnosis not present

## 2018-01-22 ENCOUNTER — Encounter: Admission: RE | Payer: Self-pay | Source: Ambulatory Visit

## 2018-01-22 ENCOUNTER — Ambulatory Visit: Admission: RE | Admit: 2018-01-22 | Payer: Medicare HMO | Source: Ambulatory Visit | Admitting: Internal Medicine

## 2018-01-22 SURGERY — COLONOSCOPY WITH PROPOFOL
Anesthesia: General

## 2018-01-23 ENCOUNTER — Ambulatory Visit: Payer: Self-pay | Admitting: Urology

## 2018-01-28 ENCOUNTER — Encounter: Payer: Self-pay | Admitting: *Deleted

## 2018-01-29 ENCOUNTER — Other Ambulatory Visit: Payer: Self-pay

## 2018-01-29 ENCOUNTER — Ambulatory Visit: Payer: Medicare HMO | Admitting: Anesthesiology

## 2018-01-29 ENCOUNTER — Encounter
Admission: RE | Disposition: A | Discharge status: Home / Self Care | Payer: Self-pay | Source: Ambulatory Visit | Attending: Internal Medicine

## 2018-01-29 ENCOUNTER — Ambulatory Visit
Admission: RE | Admit: 2018-01-29 | Discharge: 2018-01-29 | Disposition: A | Discharge status: Home / Self Care | Payer: Medicare HMO | Source: Ambulatory Visit | Attending: Internal Medicine | Admitting: Internal Medicine

## 2018-01-29 DIAGNOSIS — K640 First degree hemorrhoids: Secondary | ICD-10-CM | POA: Diagnosis not present

## 2018-01-29 DIAGNOSIS — Z7982 Long term (current) use of aspirin: Secondary | ICD-10-CM | POA: Insufficient documentation

## 2018-01-29 DIAGNOSIS — Z86010 Personal history of colonic polyps: Secondary | ICD-10-CM | POA: Diagnosis not present

## 2018-01-29 DIAGNOSIS — E1165 Type 2 diabetes mellitus with hyperglycemia: Secondary | ICD-10-CM | POA: Diagnosis not present

## 2018-01-29 DIAGNOSIS — E785 Hyperlipidemia, unspecified: Secondary | ICD-10-CM | POA: Diagnosis not present

## 2018-01-29 DIAGNOSIS — D124 Benign neoplasm of descending colon: Secondary | ICD-10-CM | POA: Diagnosis not present

## 2018-01-29 DIAGNOSIS — D126 Benign neoplasm of colon, unspecified: Secondary | ICD-10-CM | POA: Diagnosis not present

## 2018-01-29 DIAGNOSIS — K5730 Diverticulosis of large intestine without perforation or abscess without bleeding: Secondary | ICD-10-CM | POA: Insufficient documentation

## 2018-01-29 DIAGNOSIS — Z794 Long term (current) use of insulin: Secondary | ICD-10-CM | POA: Insufficient documentation

## 2018-01-29 DIAGNOSIS — E119 Type 2 diabetes mellitus without complications: Secondary | ICD-10-CM | POA: Insufficient documentation

## 2018-01-29 DIAGNOSIS — K635 Polyp of colon: Secondary | ICD-10-CM | POA: Diagnosis not present

## 2018-01-29 DIAGNOSIS — K648 Other hemorrhoids: Secondary | ICD-10-CM | POA: Diagnosis not present

## 2018-01-29 DIAGNOSIS — Z6841 Body Mass Index (BMI) 40.0 and over, adult: Secondary | ICD-10-CM | POA: Insufficient documentation

## 2018-01-29 DIAGNOSIS — D122 Benign neoplasm of ascending colon: Secondary | ICD-10-CM | POA: Diagnosis not present

## 2018-01-29 DIAGNOSIS — I10 Essential (primary) hypertension: Secondary | ICD-10-CM | POA: Diagnosis not present

## 2018-01-29 DIAGNOSIS — Z1211 Encounter for screening for malignant neoplasm of colon: Secondary | ICD-10-CM | POA: Insufficient documentation

## 2018-01-29 DIAGNOSIS — K5790 Diverticulosis of intestine, part unspecified, without perforation or abscess without bleeding: Secondary | ICD-10-CM | POA: Diagnosis not present

## 2018-01-29 HISTORY — PX: COLONOSCOPY WITH PROPOFOL: SHX5780

## 2018-01-29 LAB — GLUCOSE, CAPILLARY: Glucose-Capillary: 291 mg/dL — ABNORMAL HIGH (ref 70–99)

## 2018-01-29 SURGERY — COLONOSCOPY WITH PROPOFOL
Anesthesia: General

## 2018-01-29 MED ORDER — PROPOFOL 10 MG/ML IV BOLUS
INTRAVENOUS | Status: DC | PRN
  Administered 2018-01-29 (×2): 50 mg via INTRAVENOUS

## 2018-01-29 MED ORDER — PROPOFOL 500 MG/50ML IV EMUL
INTRAVENOUS | Status: AC
  Filled 2018-01-29: qty 50

## 2018-01-29 MED ORDER — PROPOFOL 500 MG/50ML IV EMUL
INTRAVENOUS | Status: DC | PRN
  Administered 2018-01-29: 75 ug/kg/min via INTRAVENOUS

## 2018-01-29 MED ORDER — SODIUM CHLORIDE 0.9 % IV SOLN
INTRAVENOUS | Status: DC
  Administered 2018-01-29: 07:00:00 via INTRAVENOUS

## 2018-01-29 NOTE — Transfer of Care (Signed)
Immediate Anesthesia Transfer of Care Note  Patient: Philip Carpenter.  Procedure(s) Performed: COLONOSCOPY WITH PROPOFOL (N/A )  Patient Location: PACU  Anesthesia Type:General  Level of Consciousness: awake and alert   Airway & Oxygen Therapy: Patient Spontanous Breathing  Post-op Assessment: Report given to RN  Post vital signs: Reviewed and stable  Last Vitals:  Vitals Value Taken Time  BP 157/68 01/29/2018  8:30 AM  Temp 37 C 01/29/2018  8:30 AM  Pulse 88 01/29/2018  8:30 AM  Resp 16 01/29/2018  8:30 AM  SpO2 99 % 01/29/2018  8:30 AM    Last Pain:  Vitals:   01/29/18 0829  TempSrc: Tympanic  PainSc: 0-No pain         Complications: No apparent anesthesia complications

## 2018-01-29 NOTE — Op Note (Signed)
[facility] Gastroenterology Patient Name: Philip Carpenter Procedure Date: 01/29/2018 7:30 AM MRN: [id number] Account #: [id number] Date of Birth: 1946/01/09 Admit Type: Outpatient Age: 72 Room: [facility] ENDO ROOM 2 Gender: Male Note Status: Finalized Procedure:            Colonoscopy Indications:          High risk colon cancer surveillance: Personal history                        of colonic polyps Providers:            Benay Pike. Alice Reichert MD, MD Referring MD:         Leonie Douglas. Doy Hutching, MD (Referring MD) Medicines:            Propofol per Anesthesia Complications:        No immediate complications. Procedure:            Pre-Anesthesia Assessment:                       - The risks and benefits of the procedure and the                        sedation options and risks were discussed with the                        patient. All questions were answered and informed                        consent was obtained.                       - Patient identification and proposed procedure were                        verified prior to the procedure by the nurse. The                        procedure was verified in the procedure room.                       - ASA Grade Assessment: III - A patient with severe                        systemic disease.                       - After reviewing the risks and benefits, the patient                        was deemed in satisfactory condition to undergo the                        procedure.                       After obtaining informed consent, the colonoscope was                        passed under direct vision. Throughout the procedure,  the patient's blood pressure, pulse, and oxygen                        saturations were monitored continuously. The                        Colonoscope was introduced through the anus and                        advanced to the the cecum, identified by appendiceal                         orifice and ileocecal valve. The colonoscopy was                        performed without difficulty. The patient tolerated the                        procedure well. The quality of the bowel preparation                        was good. The ileocecal valve, appendiceal orifice, and                        rectum were photographed. Findings:      The perianal and digital rectal examinations were normal. Pertinent       negatives include normal sphincter tone and normal prostate (size,       shape, and consistency).      Many small-mouthed diverticula were found in the sigmoid colon.      Two sessile polyps were found in the ascending colon. The polyps were 3       to 4 mm in size. These polyps were removed with a jumbo cold forceps.       Resection and retrieval were complete.      A 6 mm polyp was found in the ascending colon. The polyp was       semi-pedunculated. The polyp was removed with a cold snare. Polyp       resection was incomplete, and the resected tissue was not retrieved.      A 4 mm polyp was found in the descending colon. The polyp was sessile.       The polyp was removed with a cold biopsy forceps. Resection and       retrieval were complete.      The exam was otherwise without abnormality.      Non-bleeding internal hemorrhoids were found during retroflexion. The       hemorrhoids were Grade I (internal hemorrhoids that do not prolapse). Impression:           - Diverticulosis in the sigmoid colon.                       - Two 3 to 4 mm polyps in the ascending colon, removed                        with a jumbo cold forceps. Resected and retrieved.                       - One 6 mm polyp in the ascending colon, removed with a  cold snare. Incomplete resection. Resected tissue not                        retrieved.                       - One 4 mm polyp in the descending colon, removed with                        a cold biopsy forceps. Resected and  retrieved.                       - The examination was otherwise normal. Recommendation:       - Patient has a contact number available for                        emergencies. The signs and symptoms of potential                        delayed complications were discussed with the patient.                        Return to normal activities tomorrow. Written discharge                        instructions were provided to the patient.                       - Resume previous diet.                       - Continue present medications.                       - Await pathology results.                       - Repeat colonoscopy is recommended for surveillance.                        The colonoscopy date will be determined after pathology                        results from today's exam become available for review.                       - Return to GI office PRN.                       - The findings and recommendations were discussed with                        the patient and their family. Procedure Code(s):    --- Professional ---                       [phone], Colonoscopy, flexible; with removal of tumor(s),                        polyp(s), or other lesion(s) by snare technique  15176, 60, Colonoscopy, flexible; with biopsy, single                        or multiple Diagnosis Code(s):    --- Professional ---                       K57.30, Diverticulosis of large intestine without                        perforation or abscess without bleeding                       D12.4, Benign neoplasm of descending colon                       D12.2, Benign neoplasm of ascending colon                       Z86.010, Personal history of colonic polyps CPT copyright 2018 American [employer] Association. All rights reserved. The codes documented in this report are preliminary and upon coder review may  be revised to meet current compliance requirements. Efrain Sella MD, MD 01/29/2018 8:32:27  AM This report has been signed electronically. Number of Addenda: 0 Note Initiated On: 01/29/2018 7:30 AM Scope Withdrawal Time: 0 hours 10 minutes 21 seconds  Total Procedure Duration: 0 hours 13 minutes 42 seconds       [facility]

## 2018-01-29 NOTE — Interval H&P Note (Signed)
History and Physical Interval Note:  01/29/2018 8:06 AM  Philip Carpenter.  has presented today for surgery, with the diagnosis of PRS HX COLON POLYPS  The various methods of treatment have been discussed with the patient and family. After consideration of risks, benefits and other options for treatment, the patient has consented to  Procedure(s): COLONOSCOPY WITH PROPOFOL (N/A) as a surgical intervention .  The patient's history has been reviewed, patient examined, no change in status, stable for surgery.  I have reviewed the patient's chart and labs.  Questions were answered to the patient's satisfaction.     [place], [place]

## 2018-01-29 NOTE — H&P (Signed)
Outpatient short stay form Pre-procedure 01/29/2018 8:05 AM Naiyana Barbian K. Alice Reichert, M.D.  Primary Physician: Fulton Reek, M.D.  Reason for visit: Personal hx of colon polyps.  History of present illness:                           Patient presents for colonoscopy for a personal hx of colon polyps. The patient denies abdominal pain, abnormal weight loss or rectal bleeding.     Current Facility-Administered Medications:  .  0.9 %  sodium chloride infusion, , Intravenous, Continuous, [place], Benay Pike, MD, Last Rate: 20 mL/hr at 01/29/18 0725  Medications Prior to Admission  Medication Sig Dispense Refill Last Dose  . finasteride (PROSCAR) 5 MG tablet Take 5 mg by mouth daily.     Marland Kitchen aspirin (GOODSENSE ASPIRIN) 325 MG tablet Take by mouth.   01/27/2018  . atorvastatin (LIPITOR) 10 MG tablet Take by mouth.   01/27/2018  . insulin aspart (NOVOLOG) 100 UNIT/ML FlexPen Take before meals: 10 units at breakfast, 22 units at lunch and 32 units at supper   01/27/2018 at 1930  . Insulin Degludec 200 UNIT/ML SOPN Inject 82 Units into the skin.    01/27/2018  . metFORMIN (GLUCOPHAGE) 1000 MG tablet TAKE 1 TABLET TWICE DAILY WITH MEALS   Taking  . Multiple Vitamin (MULTI-VITAMINS) TABS Take by mouth.   01/27/2018  . ramipril (ALTACE) 2.5 MG capsule Take by mouth.   Taking  . tamsulosin (FLOMAX) 0.4 MG CAPS capsule Take 1 capsule (0.4 mg total) by mouth daily. (Patient not taking: Reported on 01/29/2018) 90 capsule 3 Not Taking at Unknown time     No Known Allergies   Past Medical History:  Diagnosis Date  . Diabetes mellitus without complication ([place])   . Hyperlipidemia     Review of systems:  Otherwise negative.    Physical Exam  Gen: Alert, oriented. Appears stated age.  HEENT: [place]/AT. PERRLA. Lungs: CTA, no wheezes. CV: RR nl S1, S2. Abd: soft, benign, no masses. BS+ Ext: No edema. Pulses 2+    Planned procedures: Proceed with colonoscopy. The patient understands the nature of the  planned procedure, indications, risks, alternatives and potential complications including but not limited to bleeding, infection, perforation, damage to internal organs and possible oversedation/side effects from anesthesia. The patient agrees and gives consent to proceed.  Please refer to procedure notes for findings, recommendations and patient disposition/instructions.     Cordarrell Sane K. Alice Reichert, M.D. Gastroenterology 01/29/2018  8:05 AM

## 2018-01-29 NOTE — OR Nursing (Signed)
Dr. Rosey Bath notified of FBG= 291, no treatment ordered, OK to proceed.

## 2018-01-29 NOTE — Anesthesia Preprocedure Evaluation (Signed)
Anesthesia Evaluation  Patient identified by MRN, date of birth, ID band Patient awake    Reviewed: Allergy & Precautions, H&P , NPO status , Patient's Chart, lab work & pertinent test results, reviewed documented beta blocker date and time   History of Anesthesia Complications Negative for: history of anesthetic complications  Airway Mallampati: III  TM Distance: >3 FB Neck ROM: full    Dental  (+) Dental Advidsory Given, Chipped, Teeth Intact   Pulmonary neg shortness of breath, asthma , neg sleep apnea, neg recent URI,           Cardiovascular Exercise Tolerance: Good negative cardio ROS       Neuro/Psych negative neurological ROS  negative psych ROS   GI/Hepatic negative GI ROS, Neg liver ROS,   Endo/Other  diabetesMorbid obesity  Renal/GU negative Renal ROS  negative genitourinary   Musculoskeletal   Abdominal   Peds  Hematology negative hematology ROS (+)   Anesthesia Other Findings Past Medical History: No date: Diabetes mellitus without complication (HCC) No date: Hyperlipidemia   Reproductive/Obstetrics negative OB ROS                             Anesthesia Physical Anesthesia Plan  ASA: III  Anesthesia Plan: General   Post-op Pain Management:    Induction: Intravenous  PONV Risk Score and Plan: 2 and Propofol infusion and TIVA  Airway Management Planned: Nasal Cannula  Additional Equipment:   Intra-op Plan:   Post-operative Plan:   Informed Consent: I have reviewed the patients History and Physical, chart, labs and discussed the procedure including the risks, benefits and alternatives for the proposed anesthesia with the patient or authorized representative who has indicated his/her understanding and acceptance.   Dental Advisory Given  Plan Discussed with: Anesthesiologist, CRNA and Surgeon  Anesthesia Plan Comments:         Anesthesia Quick  Evaluation

## 2018-01-29 NOTE — Anesthesia Postprocedure Evaluation (Signed)
Anesthesia Post Note  Patient: Juandavid Dallman.  Procedure(s) Performed: COLONOSCOPY WITH PROPOFOL (N/A )  Patient location during evaluation: Endoscopy Anesthesia Type: General Level of consciousness: awake and alert Pain management: pain level controlled Vital Signs Assessment: post-procedure vital signs reviewed and stable Respiratory status: spontaneous breathing, nonlabored ventilation, respiratory function stable and patient connected to nasal cannula oxygen Cardiovascular status: blood pressure returned to baseline and stable Postop Assessment: no apparent nausea or vomiting Anesthetic complications: no     Last Vitals:  Vitals:   01/29/18 0849 01/29/18 0859  BP: 125/66 127/69  Pulse: 65 (!) 59  Resp: 12 14  Temp:    SpO2: 96% 97%    Last Pain:  Vitals:   01/29/18 0859  TempSrc:   PainSc: 0-No pain                 Martha Clan

## 2018-01-29 NOTE — Anesthesia Post-op Follow-up Note (Signed)
Anesthesia QCDR form completed.        

## 2018-01-30 ENCOUNTER — Encounter: Payer: Self-pay | Admitting: Internal Medicine

## 2018-01-31 LAB — SURGICAL PATHOLOGY

## 2018-02-04 ENCOUNTER — Telehealth: Payer: Self-pay | Admitting: Urology

## 2018-02-04 MED ORDER — FINASTERIDE 5 MG PO TABS: 5.0000 mg | ORAL_TABLET | Freq: Every day | ORAL | 0 refills | Status: DC

## 2018-02-04 NOTE — Telephone Encounter (Signed)
Pt returned call and I spoke with pt and he states he is taking medication but is about to run out, informed him that I can refill enough to get him to his appt. Pt was ok with this and verified pharmacy and rx was sent. Nothing further is needed

## 2018-02-04 NOTE — Telephone Encounter (Signed)
Attempted to reach pt to verify if he is taking his Finasteride (proscar) due to a refill request from [facility] but according to Epic pt reported he was not taking.

## 2018-02-04 NOTE — Addendum Note (Signed)
Addended by: Benson Setting L on: 02/04/2018 02:26 PM   Modules accepted: Orders

## 2018-03-12 ENCOUNTER — Other Ambulatory Visit: Payer: Self-pay

## 2018-03-12 MED ORDER — FINASTERIDE 5 MG PO TABS: 5.0000 mg | ORAL_TABLET | Freq: Every day | ORAL | 0 refills | Status: DC

## 2018-03-12 NOTE — Telephone Encounter (Signed)
Sent in one refill for Finasteride to [facility]  Patient has appt on 03/22/18 with Dr Bernardo Heater

## 2018-03-22 ENCOUNTER — Ambulatory Visit: Payer: Medicare HMO | Admitting: Urology

## 2018-03-22 ENCOUNTER — Encounter: Payer: Self-pay | Admitting: Urology

## 2018-04-09 ENCOUNTER — Ambulatory Visit: Payer: Medicare HMO | Admitting: Urology

## 2018-04-09 ENCOUNTER — Encounter: Payer: Self-pay | Admitting: Urology

## 2018-04-09 VITALS — BP 118/75 | HR 83 | Ht 70.0 in | Wt 280.0 lb

## 2018-04-09 DIAGNOSIS — R350 Frequency of micturition: Secondary | ICD-10-CM | POA: Diagnosis not present

## 2018-04-09 DIAGNOSIS — N401 Enlarged prostate with lower urinary tract symptoms: Secondary | ICD-10-CM | POA: Diagnosis not present

## 2018-04-09 LAB — BLADDER SCAN AMB NON-IMAGING

## 2018-04-09 NOTE — Progress Notes (Signed)
04/09/2018 3:39 PM   Philip Carpenter 05-Oct-1945 [id number]  Referring provider: Idelle Crouch, MD [place] [facility] [place], [place] [address]  Chief Complaint  Patient presents with  . Benign Prostatic Hypertrophy    HPI: 73 year old male presents for annual follow-up of BPH with lower urinary tract symptoms.  At his visit last year he was doing fairly well on combination therapy with finasteride and tamsulosin.  Over the last few months he has noted worsening urinary frequency and nocturia every 3 hours at night.  Denies dysuria or gross hematuria.  Denies flank, abdominal, pelvic or scrotal pain.  IPSS completed today was 12/4.  In reviewing his medications he states someone told him to stop the tamsulosin but he is not sure why.  He denied orthostatic changes and thinks he was tolerating it well.  This does coincide with the worsening of his symptoms.  He did have a PSA performed by his PCP in February 2019 which was 0.34.  PMH: Past Medical History:  Diagnosis Date  . Diabetes mellitus without complication ([place])   . Hyperlipidemia     Surgical History: Past Surgical History:  Procedure Laterality Date  . colonoscopy    . COLONOSCOPY WITH PROPOFOL N/A 01/29/2018   Procedure: COLONOSCOPY WITH PROPOFOL;  Surgeon: [place], Benay Pike, MD;  Location: [facility] ENDOSCOPY;  Service: Gastroenterology;  Laterality: N/A;    Home Medications:  Allergies as of 04/09/2018   No Known Allergies     Medication List       Accurate as of April 09, 2018  3:39 PM. Always use your most recent med list.        albuterol 108 (90 Base) MCG/ACT inhaler Commonly known as:  PROVENTIL HFA;VENTOLIN HFA Inhale into the lungs.   atorvastatin 10 MG tablet Commonly known as:  LIPITOR Take by mouth.   finasteride 5 MG tablet Commonly known as:  PROSCAR Take 1 tablet (5 mg total) by mouth daily.   GOODSENSE ASPIRIN 325 MG tablet Generic drug:  aspirin Take by  mouth.   insulin aspart 100 UNIT/ML FlexPen Commonly known as:  NOVOLOG Take before meals: 10 units at breakfast, 22 units at lunch and 32 units at supper   Insulin Degludec 200 UNIT/ML Sopn Inject 82 Units into the skin.   metFORMIN 1000 MG tablet Commonly known as:  GLUCOPHAGE TAKE 1 TABLET TWICE DAILY WITH MEALS   MULTI-VITAMINS Tabs Take by mouth.   ramipril 2.5 MG capsule Commonly known as:  ALTACE Take by mouth.   tamsulosin 0.4 MG Caps capsule Commonly known as:  FLOMAX Take 1 capsule (0.4 mg total) by mouth daily.       Allergies: No Known Allergies  Family History: Family History  Problem Relation Age of Onset  . Prostate cancer Neg Hx   . Bladder Cancer Neg Hx   . Kidney cancer Neg Hx     Social History:  reports that he has never smoked. He has never used smokeless tobacco. He reports that he does not drink alcohol or use drugs.  ROS: UROLOGY Frequent Urination?: Yes Hard to postpone urination?: No Burning/pain with urination?: No Get up at night to urinate?: Yes Leakage of urine?: Yes Urine stream starts and stops?: Yes Trouble starting stream?: No Do you have to strain to urinate?: No Blood in urine?: No Urinary tract infection?: No Sexually transmitted disease?: No Injury to kidneys or bladder?: No Painful intercourse?: No Weak stream?: No Erection problems?: Yes Penile pain?:  No  Gastrointestinal Nausea?: No Vomiting?: No Indigestion/heartburn?: No Diarrhea?: No Constipation?: No  Constitutional Fever: No Night sweats?: No Weight loss?: No Fatigue?: No  Skin Skin rash/lesions?: No Itching?: No  Eyes Blurred vision?: No Double vision?: No  Ears/Nose/Throat Sore throat?: No Sinus problems?: No  Hematologic/Lymphatic Swollen glands?: No Easy bruising?: No  Cardiovascular Leg swelling?: No Chest pain?: No  Respiratory Cough?: No Shortness of breath?: No  Endocrine Excessive thirst?: No  Musculoskeletal Back  pain?: No Joint pain?: No  Neurological Headaches?: No Dizziness?: No  Psychologic Depression?: No Anxiety?: No  Physical Exam: BP 118/75 (BP Location: Left Arm, Patient Position: Sitting)   Pulse 83   Ht 5\' 10"  (1.778 m)   Wt 280 lb (127 kg)   BMI 40.18 kg/m   Constitutional:  Alert and oriented, No acute distress. HEENT: [place] AT, moist mucus membranes.  Trachea midline, no masses. Cardiovascular: No clubbing, cyanosis, or edema. Respiratory: Normal respiratory effort, no increased work of breathing. GI: Abdomen is soft, nontender, nondistended, no abdominal masses GU: No CVA tenderness Lymph: No cervical or inguinal lymphadenopathy. Skin: No rashes, bruises or suspicious lesions. Neurologic: Grossly intact, no focal deficits, moving all 4 extremities. Psychiatric: Normal mood and affect.   Assessment & Plan:    1. Benign prostatic hyperplasia with urinary frequency His voiding symptoms have worsened however he has discontinued the tamsulosin.  He was not having side effects and I recommend he restart.  If his storage related symptoms do not improve after 1 month back on the tamsulosin he was instructed to call back and will give a trial of Myrbetriq.  PVR by bladder scan today was 0 mL.  Finasteride and tamsulosin were refilled.  Abbie Sons, [place] [address] [place], [place] [address] [phone]

## 2018-04-10 ENCOUNTER — Encounter: Payer: Self-pay | Admitting: Urology

## 2018-04-10 MED ORDER — FINASTERIDE 5 MG PO TABS: 5.0000 mg | ORAL_TABLET | Freq: Every day | ORAL | 3 refills | Status: DC

## 2018-04-10 MED ORDER — TAMSULOSIN HCL 0.4 MG PO CAPS: 0.4000 mg | ORAL_CAPSULE | Freq: Every day | ORAL | 3 refills | Status: DC

## 2018-10-22 DIAGNOSIS — E1165 Type 2 diabetes mellitus with hyperglycemia: Secondary | ICD-10-CM | POA: Diagnosis not present

## 2018-10-28 DIAGNOSIS — Z9114 Patient's other noncompliance with medication regimen: Secondary | ICD-10-CM | POA: Diagnosis not present

## 2018-10-28 DIAGNOSIS — E669 Obesity, unspecified: Secondary | ICD-10-CM | POA: Diagnosis not present

## 2018-10-28 DIAGNOSIS — Z794 Long term (current) use of insulin: Secondary | ICD-10-CM | POA: Diagnosis not present

## 2018-10-28 DIAGNOSIS — E1165 Type 2 diabetes mellitus with hyperglycemia: Secondary | ICD-10-CM | POA: Diagnosis not present

## 2018-10-28 DIAGNOSIS — E1169 Type 2 diabetes mellitus with other specified complication: Secondary | ICD-10-CM | POA: Diagnosis not present

## 2018-11-15 DIAGNOSIS — Z23 Encounter for immunization: Secondary | ICD-10-CM | POA: Diagnosis not present

## 2019-01-18 ENCOUNTER — Other Ambulatory Visit: Payer: Self-pay | Admitting: Urology

## 2019-01-18 DIAGNOSIS — N401 Enlarged prostate with lower urinary tract symptoms: Secondary | ICD-10-CM

## 2019-01-29 DIAGNOSIS — E669 Obesity, unspecified: Secondary | ICD-10-CM | POA: Diagnosis not present

## 2019-01-29 DIAGNOSIS — E1169 Type 2 diabetes mellitus with other specified complication: Secondary | ICD-10-CM | POA: Diagnosis not present

## 2019-02-05 DIAGNOSIS — E1169 Type 2 diabetes mellitus with other specified complication: Secondary | ICD-10-CM | POA: Diagnosis not present

## 2019-02-05 DIAGNOSIS — E669 Obesity, unspecified: Secondary | ICD-10-CM | POA: Diagnosis not present

## 2019-02-05 DIAGNOSIS — E1165 Type 2 diabetes mellitus with hyperglycemia: Secondary | ICD-10-CM | POA: Diagnosis not present

## 2019-06-27 DIAGNOSIS — E1165 Type 2 diabetes mellitus with hyperglycemia: Secondary | ICD-10-CM | POA: Diagnosis not present

## 2019-07-02 DIAGNOSIS — Z9114 Patient's other noncompliance with medication regimen: Secondary | ICD-10-CM | POA: Diagnosis not present

## 2019-07-02 DIAGNOSIS — E669 Obesity, unspecified: Secondary | ICD-10-CM | POA: Diagnosis not present

## 2019-07-02 DIAGNOSIS — E1169 Type 2 diabetes mellitus with other specified complication: Secondary | ICD-10-CM | POA: Diagnosis not present

## 2019-07-02 DIAGNOSIS — E1165 Type 2 diabetes mellitus with hyperglycemia: Secondary | ICD-10-CM | POA: Diagnosis not present

## 2019-07-02 DIAGNOSIS — Z794 Long term (current) use of insulin: Secondary | ICD-10-CM | POA: Diagnosis not present

## 2019-08-27 IMAGING — MR MR HEAD W/O CM
11 series · 44 of 48 positions shown · non-contrast
Comparison: MRI head 12/28/2012

CLINICAL DATA: Syncope with head injury

EXAM:
MRI HEAD WITHOUT CONTRAST
TECHNIQUE: Multiplanar, multiecho pulse sequences of the brain and surrounding
structures were obtained without intravenous contrast.

[Series 5: ax dwi_tracew · axial · 3.0mm · 0.60mm/px · z∈[-60,+93]mm · 5 of 55 slices shown]
[im 1/55]
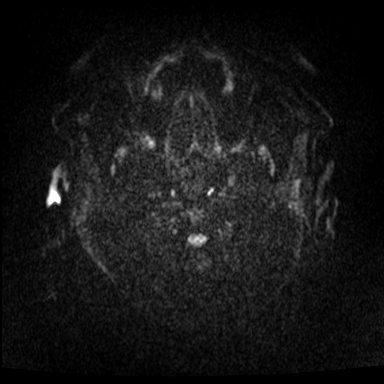
[im 14/55]
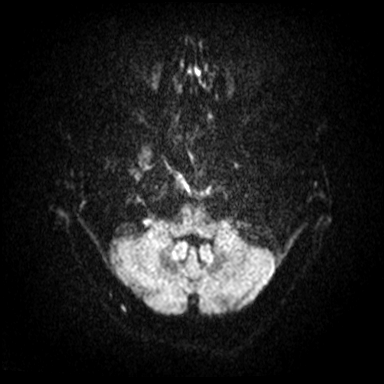
[im 28/55]
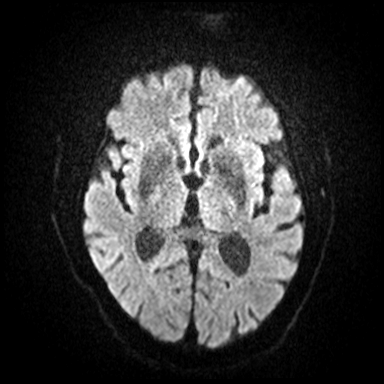
[im 41/55]
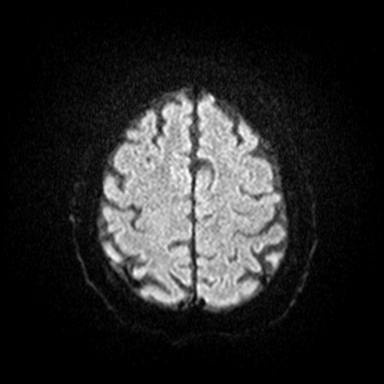
[im 55/55]
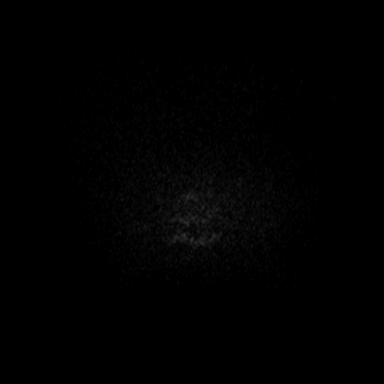

[Series 6: ax dwi_adc · axial · 3.0mm · 0.60mm/px · z∈[-60,+93]mm · 4 of 55 slices shown]
[im 1/55]
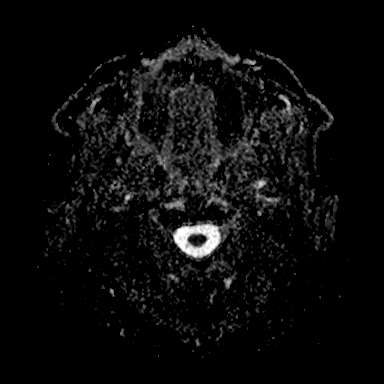
[im 19/55]
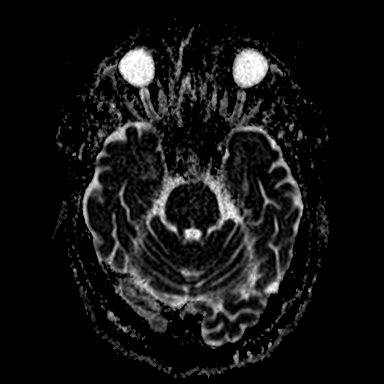
[im 37/55]
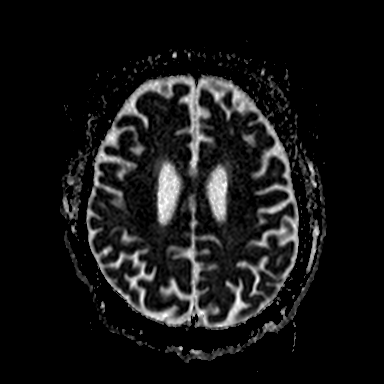
[im 55/55]
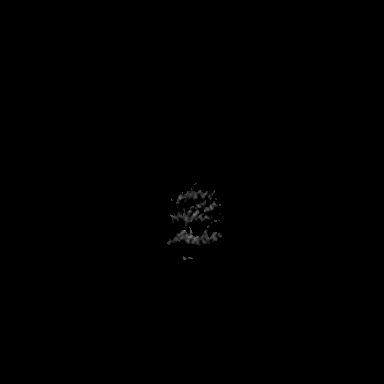

[Series 7: cor dwi_tracew · coronal · 5.0mm · 0.60mm/px · 3 of 41 slices shown]
[im 1/41]
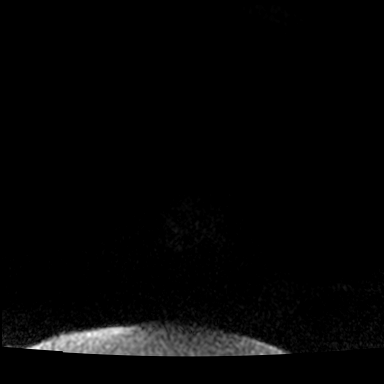
[im 21/41]
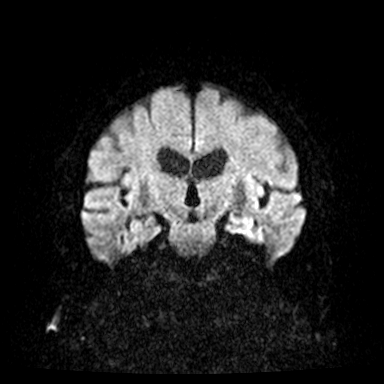
[im 41/41]
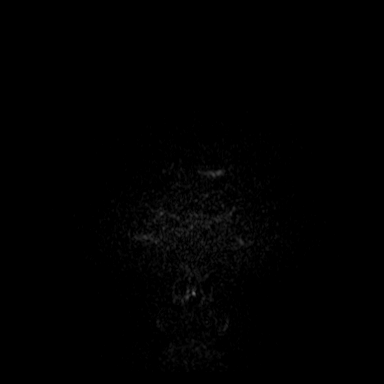

[Series 8: cor dwi_adc · coronal · 5.0mm · 0.60mm/px · 3 of 41 slices shown]
[im 1/41]
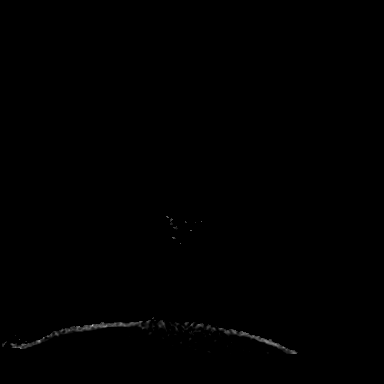
[im 21/41]
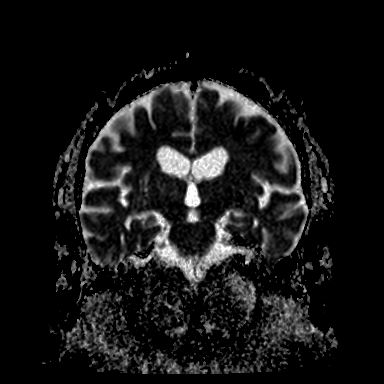
[im 41/41]
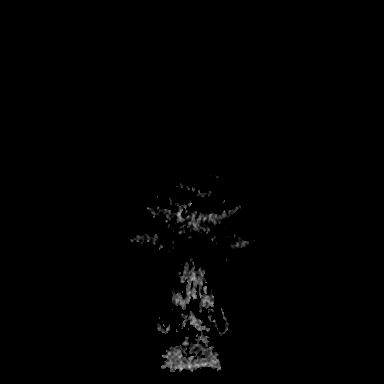

[Series 9: T1 · sagittal · 5.0mm · 0.62mm/px · 2 of 23 slices shown (1 of 2)]
[im 1/23]
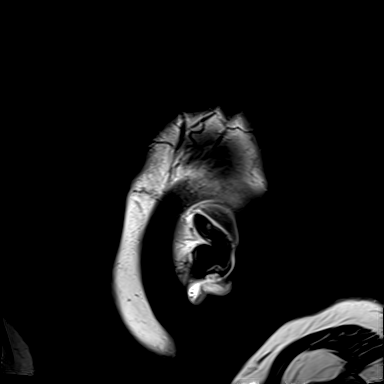
[im 23/23]
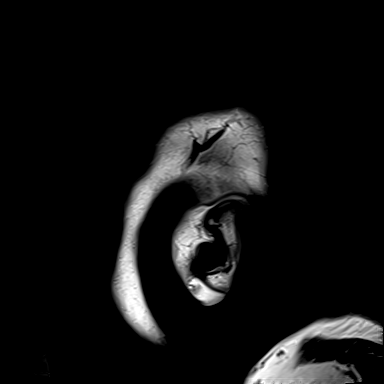

[Series 10: T2 · axial · 5.0mm · 0.53mm/px · z∈[-59,+88]mm · 2 of 27 slices shown (1 of 2)]
[im 1/27]
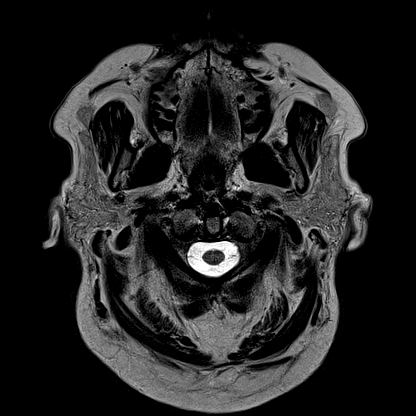
[im 27/27]
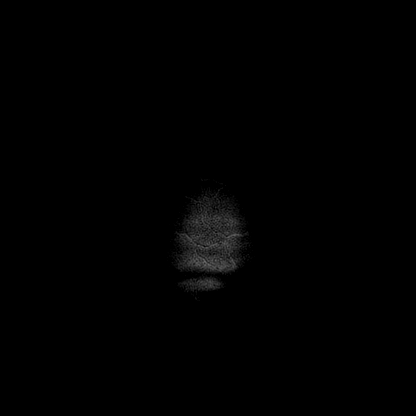

[Series 11: swi_images · axial · 3.0mm · 0.90mm/px · z∈[-67,+100]mm · 5 of 60 slices shown]
[im 1/60]
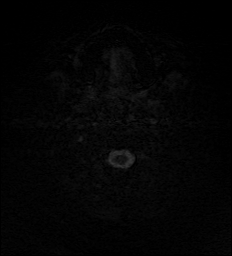
[im 15/60]
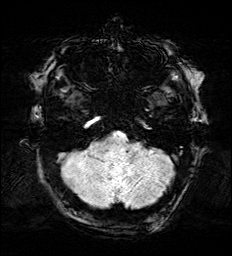
[im 30/60]
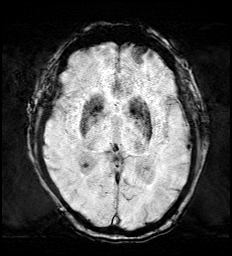
[im 45/60]
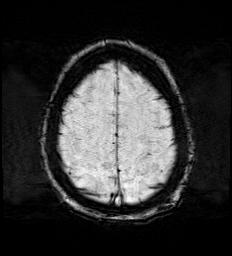
[im 60/60]
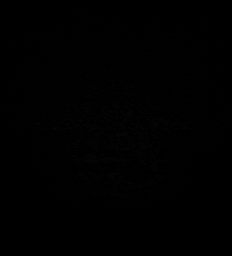

[Series 12: mip_images(sw) · axial · 24.0mm · 0.90mm/px · z∈[-57,+90]mm · 4 of 53 slices shown]
[im 1/53]
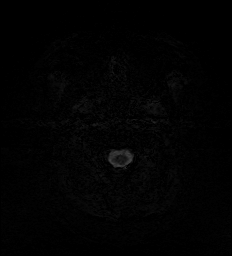
[im 18/53]
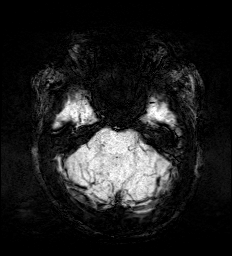
[im 35/53]
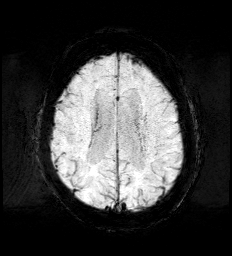
[im 53/53]
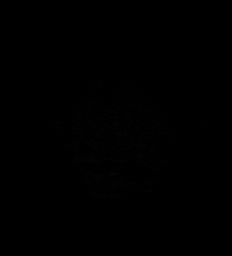

[Series 13: FLAIR · axial · 3.0mm · 0.53mm/px · z∈[-62,+91]mm · 4 of 55 slices shown]
[im 1/55]
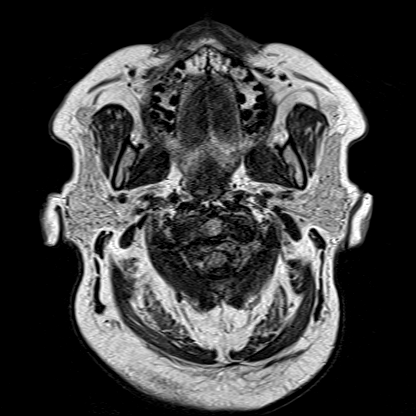
[im 19/55]
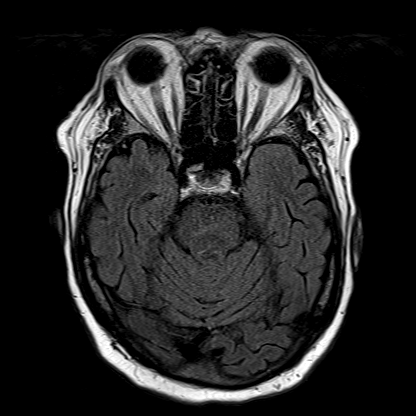
[im 37/55]
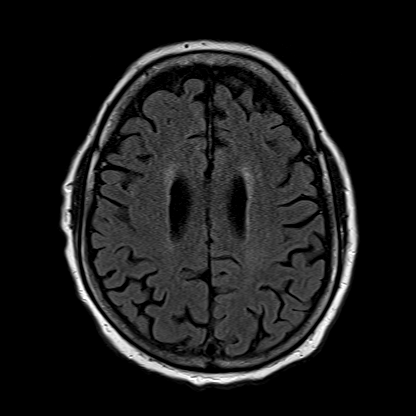
[im 55/55]
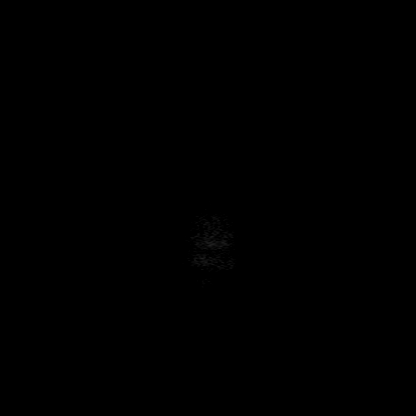

[Series 14: T1 · axial · 1.0mm · 0.98mm/px · z∈[-62,+103]mm · 10 of 176 slices shown (2 of 2)]
[im 1/176]
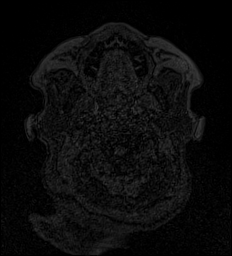
[im 14/176]
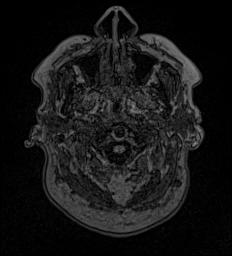
[im 27/176]
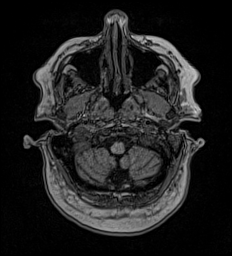
[im 41/176]
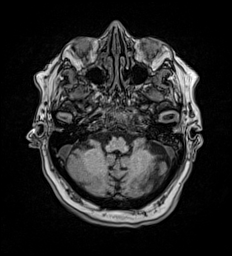
[im 54/176]
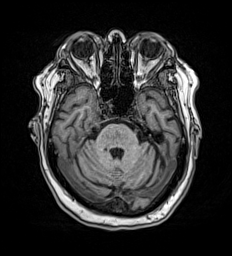
[im 81/176]
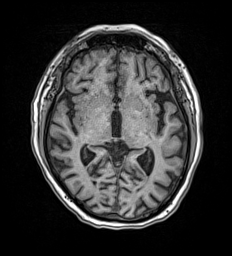
[im 95/176]
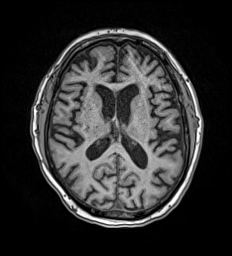
[im 122/176]
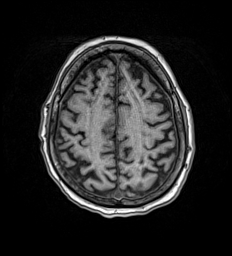
[im 149/176]
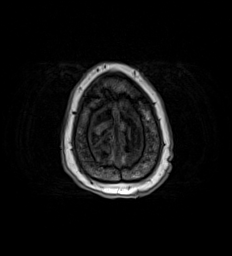
[im 176/176]
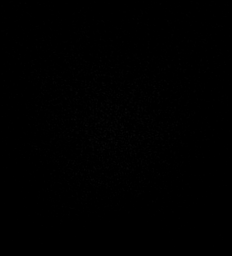

[Series 15: T2 · coronal · 5.0mm · 0.57mm/px · 2 of 31 slices shown (2 of 2)]
[im 1/31]
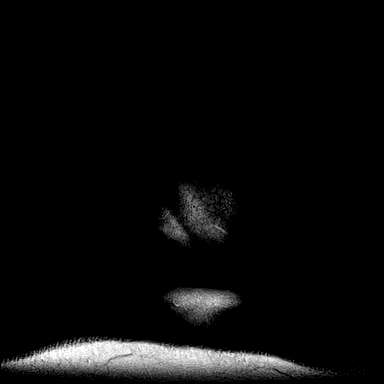
[im 31/31]
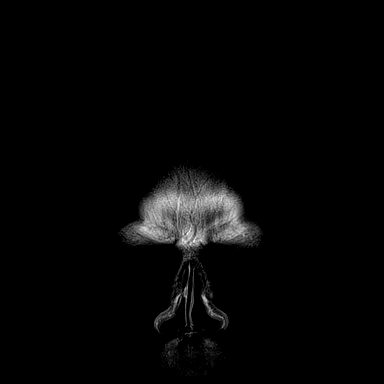

[44 of 48 positions shown; findings below may reference images not displayed]

FINDINGS: Brain: Mild atrophy. Negative for acute infarct. Mild chronic
microvascular ischemic change in the white matter. Chronic lacunar
infarction in the left thalamus. Mild chronic ischemic change in the
pons. Chronic microhemorrhage right lateral thalamus. Negative for
mass lesion.

Vascular: Normal arterial flow voids

Skull and upper cervical spine: Negative

Sinuses/Orbits: Mild mucosal edema paranasal sinuses. Left mastoid
effusion. Normal orbit.

Other: None
IMPRESSION: No acute abnormality.

Mild chronic ischemic change.

## 2019-10-07 DIAGNOSIS — E1169 Type 2 diabetes mellitus with other specified complication: Secondary | ICD-10-CM | POA: Diagnosis not present

## 2019-10-07 DIAGNOSIS — Z794 Long term (current) use of insulin: Secondary | ICD-10-CM | POA: Diagnosis not present

## 2019-10-07 DIAGNOSIS — E1165 Type 2 diabetes mellitus with hyperglycemia: Secondary | ICD-10-CM | POA: Diagnosis not present

## 2019-10-07 DIAGNOSIS — E669 Obesity, unspecified: Secondary | ICD-10-CM | POA: Diagnosis not present

## 2019-12-04 DIAGNOSIS — Z23 Encounter for immunization: Secondary | ICD-10-CM | POA: Diagnosis not present

## 2019-12-30 DIAGNOSIS — E1140 Type 2 diabetes mellitus with diabetic neuropathy, unspecified: Secondary | ICD-10-CM | POA: Diagnosis not present

## 2019-12-30 DIAGNOSIS — M25561 Pain in right knee: Secondary | ICD-10-CM | POA: Diagnosis not present

## 2019-12-30 DIAGNOSIS — M25461 Effusion, right knee: Secondary | ICD-10-CM | POA: Diagnosis not present

## 2019-12-30 DIAGNOSIS — M1711 Unilateral primary osteoarthritis, right knee: Secondary | ICD-10-CM | POA: Diagnosis not present

## 2019-12-30 DIAGNOSIS — R531 Weakness: Secondary | ICD-10-CM | POA: Diagnosis not present

## 2019-12-30 DIAGNOSIS — Y92009 Unspecified place in unspecified non-institutional (private) residence as the place of occurrence of the external cause: Secondary | ICD-10-CM | POA: Diagnosis not present

## 2019-12-30 DIAGNOSIS — W010XXA Fall on same level from slipping, tripping and stumbling without subsequent striking against object, initial encounter: Secondary | ICD-10-CM | POA: Diagnosis not present

## 2019-12-30 DIAGNOSIS — Z794 Long term (current) use of insulin: Secondary | ICD-10-CM | POA: Diagnosis not present

## 2020-01-06 DIAGNOSIS — E1165 Type 2 diabetes mellitus with hyperglycemia: Secondary | ICD-10-CM | POA: Diagnosis not present

## 2020-01-13 DIAGNOSIS — Z794 Long term (current) use of insulin: Secondary | ICD-10-CM | POA: Diagnosis not present

## 2020-01-13 DIAGNOSIS — E1165 Type 2 diabetes mellitus with hyperglycemia: Secondary | ICD-10-CM | POA: Diagnosis not present

## 2020-01-13 DIAGNOSIS — E669 Obesity, unspecified: Secondary | ICD-10-CM | POA: Diagnosis not present

## 2020-01-13 DIAGNOSIS — E1169 Type 2 diabetes mellitus with other specified complication: Secondary | ICD-10-CM | POA: Diagnosis not present

## 2020-01-16 ENCOUNTER — Other Ambulatory Visit: Payer: Self-pay | Admitting: Urology

## 2020-01-16 DIAGNOSIS — N401 Enlarged prostate with lower urinary tract symptoms: Secondary | ICD-10-CM

## 2020-11-24 ENCOUNTER — Ambulatory Visit: Payer: Medicare HMO | Admitting: Urology

## 2020-11-24 ENCOUNTER — Encounter: Payer: Self-pay | Admitting: Urology

## 2020-11-24 ENCOUNTER — Other Ambulatory Visit: Payer: Self-pay

## 2020-11-24 VITALS — BP 139/64 | HR 70 | Ht 70.0 in | Wt 238.0 lb

## 2020-11-24 DIAGNOSIS — R351 Nocturia: Secondary | ICD-10-CM

## 2020-11-24 DIAGNOSIS — N3941 Urge incontinence: Secondary | ICD-10-CM

## 2020-11-24 DIAGNOSIS — N401 Enlarged prostate with lower urinary tract symptoms: Secondary | ICD-10-CM

## 2020-11-24 LAB — BLADDER SCAN AMB NON-IMAGING: Scan Result: 61

## 2020-11-24 MED ORDER — MIRABEGRON ER 25 MG PO TB24
25.0000 mg | ORAL_TABLET | Freq: Every day | ORAL | 0 refills | Status: DC
Start: 2020-11-24 — End: 2020-12-29

## 2020-11-24 NOTE — Progress Notes (Signed)
11/24/2020 1:21 PM   Rhona Leavens 07-20-1945 [id number]  Referring provider: Idelle Crouch, MD [facility] [place],  [place] [address]  Chief Complaint  Patient presents with   Urinary Frequency    HPI: 75 y.o. male followed for BPH called for appointment for evaluation of urinary incontinence.  Last seen 03/2018 Was on tamsulosin/finasteride with IPSS at that visit 12/35 Presents with a 28-month history of urinary urgency with urge incontinence Has been treated for recurrent E. coli bacteriuria however this has not improved his voiding pattern Remains on tamsulosin and finasteride UA performed 11/17/2020 was normal   PMH: Past Medical History:  Diagnosis Date   Diabetes mellitus without complication ([place])    Hyperlipidemia     Surgical History: Past Surgical History:  Procedure Laterality Date   colonoscopy     COLONOSCOPY WITH PROPOFOL N/A 01/29/2018   Procedure: COLONOSCOPY WITH PROPOFOL;  Surgeon: Toledo, Benay Pike, MD;  Location: [facility] ENDOSCOPY;  Service: Gastroenterology;  Laterality: N/A;    Home Medications:  Allergies as of 11/24/2020   No Known Allergies      Medication List        Accurate as of November 24, 2020  1:21 PM. If you have any questions, ask your nurse or doctor.          albuterol 108 (90 Base) MCG/ACT inhaler Commonly known as: VENTOLIN HFA Inhale into the lungs.   aspirin 325 MG tablet Take by mouth.   atorvastatin 10 MG tablet Commonly known as: LIPITOR Take by mouth.   finasteride 5 MG tablet Commonly known as: PROSCAR TAKE 1 TABLET (5 MG TOTAL) BY MOUTH DAILY.   insulin aspart 100 UNIT/ML FlexPen Commonly known as: NOVOLOG Take before meals: 10 units at breakfast, 22 units at lunch and 32 units at supper   insulin degludec 200 UNIT/ML FlexTouch Pen Commonly known as: TRESIBA Inject 82 Units into the skin.   metFORMIN 1000 MG tablet Commonly known as: GLUCOPHAGE TAKE 1  TABLET TWICE DAILY WITH MEALS   metFORMIN 1000 MG tablet Commonly known as: GLUCOPHAGE Take 1 tablet by mouth 2 (two) times daily with a meal.   mirabegron ER 25 MG Tb24 tablet Commonly known as: MYRBETRIQ Take 1 tablet (25 mg total) by mouth daily. Started by: Abbie Sons, MD   Multi-Vitamins Tabs Take by mouth.   ramipril 2.5 MG capsule Commonly known as: ALTACE Take by mouth.   tamsulosin 0.4 MG Caps capsule Commonly known as: FLOMAX TAKE 1 CAPSULE (0.4 MG TOTAL) BY MOUTH DAILY.        Allergies: No Known Allergies  Family History: Family History  Problem Relation Age of Onset   Prostate cancer Neg Hx    Bladder Cancer Neg Hx    Kidney cancer Neg Hx     Social History:  reports that he has never smoked. He has never used smokeless tobacco. He reports that he does not drink alcohol and does not use drugs.   Physical Exam: BP 139/64   Pulse 70   Ht 5\' 10"  (1.778 m)   Wt 238 lb (108 kg)   BMI 34.15 kg/m   Constitutional:  Alert and oriented, No acute distress. HEENT: [place] AT, moist mucus membranes.  Trachea midline, no masses. Cardiovascular: No clubbing, cyanosis, or edema. Respiratory: Normal respiratory effort, no increased work of breathing. GI: Abdomen is soft, nontender, nondistended, no abdominal masses GU: Prostate 35 g, smooth without nodules   Assessment &  Plan:    1.  BPH with LUTS Worsening storage related voiding symptoms Continue tamsulosin/finasteride  2.  Urge incontinence Bladder scan PVR obtained ~ 30 minutes after voiding was 61 mL Trial Myrbetriq 25 mg daily Follow-up 1 month symptom reassessment   Abbie Sons, MD  [facility] [address], [place], [place] [address] [phone]

## 2020-12-29 ENCOUNTER — Encounter: Payer: Self-pay | Admitting: Urology

## 2020-12-29 ENCOUNTER — Ambulatory Visit: Payer: Medicare HMO | Admitting: Urology

## 2020-12-29 ENCOUNTER — Other Ambulatory Visit: Payer: Self-pay

## 2020-12-29 VITALS — BP 134/71 | HR 70 | Ht 70.0 in | Wt 243.0 lb

## 2020-12-29 DIAGNOSIS — N3941 Urge incontinence: Secondary | ICD-10-CM | POA: Diagnosis not present

## 2020-12-29 DIAGNOSIS — N401 Enlarged prostate with lower urinary tract symptoms: Secondary | ICD-10-CM | POA: Diagnosis not present

## 2020-12-29 LAB — BLADDER SCAN AMB NON-IMAGING: Scan Result: 0

## 2020-12-29 MED ORDER — MIRABEGRON ER 25 MG PO TB24: 25.0000 mg | ORAL_TABLET | Freq: Every day | ORAL | 11 refills | Status: DC

## 2020-12-29 NOTE — Progress Notes (Signed)
12/29/2020 1:26 PM   Philip Carpenter 1945/09/18 [id number]  Referring provider: Idelle Crouch, MD [facility] [place],  [place] [address]  Chief Complaint  Patient presents with   Benign Prostatic Hypertrophy    HPI: 75 y.o. male presents for 1 month follow-up.  Seen 11/24/2020 with complaints of urgency and urge incontinence x3 months On tamsulosin/finasteride Was given samples of Myrbetriq 25 mg daily and noted significant improvement in his storage related voiding symptoms Bladder scan PVR today 0 mL   PMH: Past Medical History:  Diagnosis Date   Diabetes mellitus without complication ([place])    Hyperlipidemia     Surgical History: Past Surgical History:  Procedure Laterality Date   colonoscopy     COLONOSCOPY WITH PROPOFOL N/A 01/29/2018   Procedure: COLONOSCOPY WITH PROPOFOL;  Surgeon: Toledo, Benay Pike, MD;  Location: [facility] ENDOSCOPY;  Service: Gastroenterology;  Laterality: N/A;    Home Medications:  Allergies as of 12/29/2020   No Known Allergies      Medication List        Accurate as of December 29, 2020  1:26 PM. If you have any questions, ask your nurse or doctor.          albuterol 108 (90 Base) MCG/ACT inhaler Commonly known as: VENTOLIN HFA Inhale into the lungs.   aspirin 325 MG tablet Take by mouth.   atorvastatin 10 MG tablet Commonly known as: LIPITOR Take by mouth.   finasteride 5 MG tablet Commonly known as: PROSCAR TAKE 1 TABLET (5 MG TOTAL) BY MOUTH DAILY.   insulin aspart 100 UNIT/ML FlexPen Commonly known as: NOVOLOG Take before meals: 10 units at breakfast, 22 units at lunch and 32 units at supper   insulin degludec 200 UNIT/ML FlexTouch Pen Commonly known as: TRESIBA Inject 82 Units into the skin.   metFORMIN 1000 MG tablet Commonly known as: GLUCOPHAGE TAKE 1 TABLET TWICE DAILY WITH MEALS   metFORMIN 1000 MG tablet Commonly known as: GLUCOPHAGE Take 1 tablet by mouth 2 (two)  times daily with a meal.   mirabegron ER 25 MG Tb24 tablet Commonly known as: MYRBETRIQ Take 1 tablet (25 mg total) by mouth daily.   Multi-Vitamins Tabs Take by mouth.   ramipril 2.5 MG capsule Commonly known as: ALTACE Take by mouth.   tamsulosin 0.4 MG Caps capsule Commonly known as: FLOMAX TAKE 1 CAPSULE (0.4 MG TOTAL) BY MOUTH DAILY.        Allergies: No Known Allergies  Family History: Family History  Problem Relation Age of Onset   Prostate cancer Neg Hx    Bladder Cancer Neg Hx    Kidney cancer Neg Hx     Social History:  reports that he has never smoked. He has never used smokeless tobacco. He reports that he does not drink alcohol and does not use drugs.   Physical Exam: BP 134/71   Pulse 70   Ht 5\' 10"  (1.778 m)   Wt 243 lb (110.2 kg)   BMI 34.87 kg/m   Constitutional:  Alert and oriented, No acute distress. HEENT: [place] AT, moist mucus membranes.  Trachea midline, no masses. Cardiovascular: No clubbing, cyanosis, or edema. Respiratory: Normal respiratory effort, no increased work of breathing. Psychiatric: Normal mood and affect.   Assessment & Plan:    1. Benign prostatic hyperplasia with lower urinary tract symptoms, symptom details unspecified Stable  2.  Urgency/urge incontinence Marked improvement on Myrbetriq Rx sent to pharmacy Follow-up annually or as  needed for any change in symptoms   Abbie Sons, MD  [facility] [address], [place],  [address] [phone]

## 2021-01-14 ENCOUNTER — Other Ambulatory Visit: Payer: Self-pay

## 2021-01-14 ENCOUNTER — Ambulatory Visit: Payer: Medicare HMO | Admitting: Physician Assistant

## 2021-01-14 VITALS — BP 154/83 | HR 61 | Ht 71.0 in | Wt 240.0 lb

## 2021-01-14 DIAGNOSIS — N401 Enlarged prostate with lower urinary tract symptoms: Secondary | ICD-10-CM

## 2021-01-14 DIAGNOSIS — R350 Frequency of micturition: Secondary | ICD-10-CM | POA: Diagnosis not present

## 2021-01-14 LAB — BLADDER SCAN AMB NON-IMAGING: Scan Result: 0

## 2021-01-14 MED ORDER — TROSPIUM CHLORIDE ER 60 MG PO CP24: 1.0000 | ORAL_CAPSULE | Freq: Every day | ORAL | 0 refills | Status: DC

## 2021-01-14 NOTE — Patient Instructions (Signed)
You may stop Myrbetriq. We'll start you on trospium instead. I'll see you back in clinic next month for a symptom recheck.  Please discuss pursuing a sleep study with your PCP given your reports of snoring and nighttime urination, which can be signs of undiagnosed sleep apnea.

## 2021-01-14 NOTE — Progress Notes (Signed)
01/14/2021 12:41 PM   Rhona Leavens [birth year] [id number]  CC: Chief Complaint  Patient presents with   Follow-up   HPI: Dhruv Christina. is a 75 y.o. male with PMH diabetes and BPH on Flomax and finasteride with a recent history of urgency and urge incontinence who previously responded well to Myrbetriq 25 mg daily who presents today to discuss treatment failure.  He is accompanied today by his wife, who contributes to HPI.  Today he reports the Myrbetriq is stopped working for him and he is having bothersome frequency and urgency.  His symptoms lasted all day and night with nocturia every 2 hours.  He denies dysuria.  He has never had a sleep study done but reports that he does snore.  Additionally, Myrbetriq 25 mg prescription was cost prohibitive for them at greater than $100 per month.  PVR 0 mL  PMH: Past Medical History:  Diagnosis Date   Diabetes mellitus without complication ([place])    Hyperlipidemia     Surgical History: Past Surgical History:  Procedure Laterality Date   colonoscopy     COLONOSCOPY WITH PROPOFOL N/A 01/29/2018   Procedure: COLONOSCOPY WITH PROPOFOL;  Surgeon: Toledo, Benay Pike, MD;  Location: [facility] ENDOSCOPY;  Service: Gastroenterology;  Laterality: N/A;    Home Medications:  Allergies as of 01/14/2021   No Known Allergies      Medication List        Accurate as of January 14, 2021 12:41 PM. If you have any questions, ask your nurse or doctor.          STOP taking these medications    albuterol 108 (90 Base) MCG/ACT inhaler Commonly known as: VENTOLIN HFA Stopped by: Debroah Loop, PA-C   mirabegron ER 25 MG Tb24 tablet Commonly known as: MYRBETRIQ Stopped by: Debroah Loop, PA-C       TAKE these medications    aspirin 325 MG tablet Take by mouth.   atorvastatin 10 MG tablet Commonly known as: LIPITOR Take by mouth.   finasteride 5 MG tablet Commonly known as: PROSCAR TAKE 1 TABLET (5 MG  TOTAL) BY MOUTH DAILY.   glipiZIDE 10 MG tablet Commonly known as: GLUCOTROL Take 10 mg by mouth daily.   insulin aspart 100 UNIT/ML FlexPen Commonly known as: NOVOLOG Take before meals: 10 units at breakfast, 22 units at lunch and 32 units at supper   insulin degludec 200 UNIT/ML FlexTouch Pen Commonly known as: TRESIBA Inject 82 Units into the skin.   metFORMIN 1000 MG tablet Commonly known as: GLUCOPHAGE TAKE 1 TABLET TWICE DAILY WITH MEALS What changed: Another medication with the same name was removed. Continue taking this medication, and follow the directions you see here. Changed by: Debroah Loop, PA-C   Multi-Vitamins Tabs Take by mouth.   ramipril 5 MG capsule Commonly known as: ALTACE Take by mouth. What changed: Another medication with the same name was removed. Continue taking this medication, and follow the directions you see here. Changed by: Debroah Loop, PA-C   tamsulosin 0.4 MG Caps capsule Commonly known as: FLOMAX TAKE 1 CAPSULE (0.4 MG TOTAL) BY MOUTH DAILY.   Trospium Chloride 60 MG Cp24 Take 1 capsule (60 mg total) by mouth daily. Started by: Debroah Loop, PA-C        Allergies:  No Known Allergies  Family History: Family History  Problem Relation Age of Onset   Prostate cancer Neg Hx    Bladder Cancer Neg Hx    Kidney cancer Neg  Hx     Social History:   reports that he has never smoked. He has never used smokeless tobacco. He reports that he does not drink alcohol and does not use drugs.  Physical Exam: BP (!) 154/83   Pulse 61   Ht 5\' 11"  (1.803 m)   Wt 240 lb (108.9 kg)   BMI 33.47 kg/m   Constitutional:  Alert and oriented, no acute distress, nontoxic appearing HEENT: [place], AT Cardiovascular: No clubbing, cyanosis, or edema Respiratory: Normal respiratory effort, no increased work of breathing Skin: No rashes, bruises or suspicious lesions Neurologic: Grossly intact, no focal deficits, moving all 4  extremities Psychiatric: Normal mood and affect  Laboratory Data: Results for orders placed or performed in visit on 01/14/21  BLADDER SCAN AMB NON-IMAGING  Result Value Ref Range   Scan Result 0 ml    Assessment & Plan:   1. Benign prostatic hyperplasia with urinary frequency Myrbetriq losing effect and also cost prohibitive.  We will switch him to a trial of trospium with plans for symptom recheck and PVR in 4 weeks.  If this is not effective, consider PTNS versus intravesical Botox versus InterStim.  We discussed that undiagnosed sleep apnea may be playing a role in his nighttime symptoms and I encouraged him to follow-up with his PCP to discuss pursuing a sleep study.  He expressed understanding. - BLADDER SCAN AMB NON-IMAGING - Trospium Chloride 60 MG CP24; Take 1 capsule (60 mg total) by mouth daily.  Dispense: 30 capsule; Refill: 0  Return in about 4 weeks (around 02/11/2021) for Symptom recheck with PVR.  Debroah Loop, PA-C  [facility] [address] [place], [place] [address] [phone]

## 2021-01-31 ENCOUNTER — Other Ambulatory Visit: Payer: Self-pay | Admitting: Physician Assistant

## 2021-01-31 DIAGNOSIS — N401 Enlarged prostate with lower urinary tract symptoms: Secondary | ICD-10-CM

## 2021-01-31 DIAGNOSIS — R350 Frequency of micturition: Secondary | ICD-10-CM

## 2021-02-02 ENCOUNTER — Telehealth: Payer: Self-pay

## 2021-02-02 MED ORDER — TROSPIUM CHLORIDE ER 60 MG PO CP24: 60.0000 mg | ORAL_CAPSULE | Freq: Every day | ORAL | 0 refills | Status: DC

## 2021-02-02 NOTE — Addendum Note (Signed)
Addended by: Tommy Rainwater on: 02/02/2021 04:51 PM   Modules accepted: Orders

## 2021-02-02 NOTE — Telephone Encounter (Signed)
Patient left a voice mail on triage line, attempted to return call no answer left message to return call

## 2021-02-02 NOTE — Telephone Encounter (Signed)
Patient and wife returned to office stating a refill was not needed but rather a new script. Patient states that he started taking two tablets daily 4-5 days ago since one daily was not making any difference in leakage symptoms. He states while taking two tablets he has been dry. It was explained that Trospium 60 mg is the highest dose, taking two tablets daily is not recommended and he should stop due to side effect risk. Patient is currently out of medication due to taking two capsles daily. Patient is very frustrated with his current incontinence symptoms, conversation became very circular. Patient was given multiple options for follow up to help address his symptoms and concerns. Patient chose to have 7 days of Trospium sent into [facility] with GoodRx coupon printed, one tablet daily. He will keep a voiding dairy over the next week to monitor symptoms and bring with him to his visit with Sam next week. He will then review this and his concerns with Sam at that time and discuss treatment plan going forward.  Called and spoke with pharmacist Ebony Hail at [facility] and cancelled refill on Trospium refill, verbal confirmation given.

## 2021-02-02 NOTE — Telephone Encounter (Signed)
Patient came into the office and states he would like a refill on the Trospium (refill request started and pending previously on 01/31/21 sent in and signed today), refill sent into pharmacy patient will keep follow up as scheduled

## 2021-02-08 ENCOUNTER — Encounter: Payer: Self-pay | Admitting: Internal Medicine

## 2021-02-09 ENCOUNTER — Ambulatory Visit: Payer: Medicare HMO | Admitting: Anesthesiology

## 2021-02-09 ENCOUNTER — Encounter: Payer: Self-pay | Admitting: Internal Medicine

## 2021-02-09 ENCOUNTER — Encounter
Admission: RE | Disposition: A | Discharge status: Home / Self Care | Payer: Self-pay | Source: Home / Self Care | Attending: Internal Medicine

## 2021-02-09 ENCOUNTER — Ambulatory Visit
Admission: RE | Admit: 2021-02-09 | Discharge: 2021-02-09 | Disposition: A | Discharge status: Home / Self Care | Payer: Medicare HMO | Attending: Internal Medicine | Admitting: Internal Medicine

## 2021-02-09 DIAGNOSIS — K5730 Diverticulosis of large intestine without perforation or abscess without bleeding: Secondary | ICD-10-CM | POA: Insufficient documentation

## 2021-02-09 DIAGNOSIS — K640 First degree hemorrhoids: Secondary | ICD-10-CM | POA: Insufficient documentation

## 2021-02-09 DIAGNOSIS — E119 Type 2 diabetes mellitus without complications: Secondary | ICD-10-CM | POA: Insufficient documentation

## 2021-02-09 DIAGNOSIS — D122 Benign neoplasm of ascending colon: Secondary | ICD-10-CM | POA: Insufficient documentation

## 2021-02-09 DIAGNOSIS — Z1211 Encounter for screening for malignant neoplasm of colon: Secondary | ICD-10-CM | POA: Insufficient documentation

## 2021-02-09 HISTORY — DX: Obesity, unspecified: E66.9

## 2021-02-09 HISTORY — DX: Cerebral infarction, unspecified: I63.9

## 2021-02-09 HISTORY — DX: Malignant melanoma of scalp and neck: C43.4

## 2021-02-09 HISTORY — PX: COLONOSCOPY: SHX5424

## 2021-02-09 HISTORY — DX: Unspecified asthma, uncomplicated: J45.909

## 2021-02-09 HISTORY — DX: Essential (primary) hypertension: I10

## 2021-02-09 LAB — GLUCOSE, CAPILLARY: Glucose-Capillary: 178 mg/dL — ABNORMAL HIGH (ref 70–99)

## 2021-02-09 SURGERY — COLONOSCOPY
Anesthesia: General

## 2021-02-09 MED ORDER — PROPOFOL 10 MG/ML IV BOLUS
INTRAVENOUS | Status: DC | PRN
  Administered 2021-02-09: 70 mg via INTRAVENOUS

## 2021-02-09 MED ORDER — PROPOFOL 500 MG/50ML IV EMUL
INTRAVENOUS | Status: AC
  Filled 2021-02-09: qty 50

## 2021-02-09 MED ORDER — PHENYLEPHRINE HCL (PRESSORS) 10 MG/ML IV SOLN
INTRAVENOUS | Status: AC
  Filled 2021-02-09: qty 1

## 2021-02-09 MED ORDER — SODIUM CHLORIDE 0.9 % IV SOLN: INTRAVENOUS | Status: DC

## 2021-02-09 MED ORDER — PROPOFOL 500 MG/50ML IV EMUL
INTRAVENOUS | Status: DC | PRN
  Administered 2021-02-09: 150 ug/kg/min via INTRAVENOUS

## 2021-02-09 NOTE — Anesthesia Postprocedure Evaluation (Signed)
Anesthesia Post Note  Patient: Philip Carpenter.  Procedure(s) Performed: COLONOSCOPY  Patient location during evaluation: Endoscopy Anesthesia Type: General Level of consciousness: awake and alert Pain management: pain level controlled Vital Signs Assessment: post-procedure vital signs reviewed and stable Respiratory status: spontaneous breathing, nonlabored ventilation, respiratory function stable and patient connected to nasal cannula oxygen Cardiovascular status: blood pressure returned to baseline and stable Postop Assessment: no apparent nausea or vomiting Anesthetic complications: no   No notable events documented.   Last Vitals:  Vitals:   02/09/21 0757 02/09/21 0857  BP: (!) 157/70 120/61  Pulse: 76   Resp: 18   Temp: (!) 35.9 C   SpO2: 100%     Last Pain:  Vitals:   02/09/21 0927  TempSrc:   PainSc: 0-No pain                 Arita Miss

## 2021-02-09 NOTE — H&P (Signed)
Outpatient short stay form Pre-procedure 02/09/2021 8:33 AM Philip Carpenter K. Alice Reichert, M.D.  Primary Physician: Fulton Reek, M.D.  Reason for visit:  Personal history of adenomatous colon polyps  History of present illness:                            Patient presents for colonoscopy for a personal hx of colon polyps. The patient denies abdominal pain, abnormal weight loss or rectal bleeding.      Current Facility-Administered Medications:    0.9 %  sodium chloride infusion, , Intravenous, Continuous, Donaldson, Benay Pike, MD, Last Rate: 20 mL/hr at 02/09/21 0815, New Bag at 02/09/21 0815  Medications Prior to Admission  Medication Sig Dispense Refill Last Dose   insulin glargine (LANTUS) 100 UNIT/ML injection Inject 30 Units into the skin at bedtime.   02/07/2021   aspirin 325 MG tablet Take by mouth.   02/05/2021   atorvastatin (LIPITOR) 10 MG tablet Take by mouth.   02/07/2021   finasteride (PROSCAR) 5 MG tablet TAKE 1 TABLET (5 MG TOTAL) BY MOUTH DAILY. 90 tablet 3 02/07/2021   glipiZIDE (GLUCOTROL) 10 MG tablet Take 10 mg by mouth daily.   02/07/2021   insulin aspart (NOVOLOG) 100 UNIT/ML FlexPen Take before meals: 10 units at breakfast, 22 units at lunch and 32 units at supper   02/07/2021   Insulin Degludec 200 UNIT/ML SOPN Inject 82 Units into the skin.    02/07/2021   metFORMIN (GLUCOPHAGE) 1000 MG tablet TAKE 1 TABLET TWICE DAILY WITH MEALS   02/07/2021   Multiple Vitamin (MULTI-VITAMINS) TABS Take by mouth. (Patient not taking: Reported on 02/09/2021)   Not Taking   ramipril (ALTACE) 5 MG capsule Take by mouth.   02/07/2021   tamsulosin (FLOMAX) 0.4 MG CAPS capsule TAKE 1 CAPSULE (0.4 MG TOTAL) BY MOUTH DAILY. 90 capsule 3 02/07/2021   Trospium Chloride 60 MG CP24 Take 1 capsule (60 mg total) by mouth daily. (Patient not taking: Reported on 02/09/2021) 7 capsule 0 Not Taking     No Known Allergies   Past Medical History:  Diagnosis Date   Asthma    Diabetes mellitus without  complication ([place])    Hyperlipidemia    Hypertension    Melanoma of scalp ([place])    Obesity    Stroke [facility])     Review of systems:  Otherwise negative.    Physical Exam  Gen: Alert, oriented. Appears stated age.  HEENT: [place]/AT. PERRLA. Lungs: CTA, no wheezes. CV: RR nl S1, S2. Abd: soft, benign, no masses. BS+ Ext: No edema. Pulses 2+    Planned procedures: Proceed with colonoscopy. The patient understands the nature of the planned procedure, indications, risks, alternatives and potential complications including but not limited to bleeding, infection, perforation, damage to internal organs and possible oversedation/side effects from anesthesia. The patient agrees and gives consent to proceed.  Please refer to procedure notes for findings, recommendations and patient disposition/instructions.     Maryclare Nydam K. Alice Reichert, M.D. Gastroenterology 02/09/2021  8:33 AM

## 2021-02-09 NOTE — Transfer of Care (Signed)
Immediate Anesthesia Transfer of Care Note  Patient: Philip Carpenter.  Procedure(s) Performed: COLONOSCOPY  Patient Location: PACU  Anesthesia Type:General  Level of Consciousness: awake and alert   Airway & Oxygen Therapy: Patient Spontanous Breathing  Post-op Assessment: Report given to RN and Post -op Vital signs reviewed and stable  Post vital signs: Reviewed and stable  Last Vitals:  Vitals Value Taken Time  BP 120/61 02/09/21 0858  Temp    Pulse 71 02/09/21 0858  Resp 14 02/09/21 0858  SpO2 96 % 02/09/21 0858  Vitals shown include unvalidated device data.  Last Pain:  Vitals:   02/09/21 0757  TempSrc: Temporal  PainSc: 0-No pain         Complications: No notable events documented.

## 2021-02-09 NOTE — Op Note (Signed)
[facility] Patient Name: Philip Carpenter Procedure Date: 02/09/2021 8:33 AM MRN: [id number] Account #: [id number] Date of Birth: Jun 30, 1945 Admit Type: Outpatient Age: 75 Room: [facility] ENDO ROOM 2 Gender: Male Note Status: Finalized Instrument Name: Jasper Riling [id number] Procedure:             Colonoscopy Indications:           Surveillance: Personal history of adenomatous polyps                         on last colonoscopy > 5 years ago Providers:             Lorie Apley K. Lelania Bia MD, MD Medicines:             Propofol per Anesthesia Complications:         No immediate complications. Procedure:             Pre-Anesthesia Assessment:                        - The risks and benefits of the procedure and the                         sedation options and risks were discussed with the                         patient. All questions were answered and informed                         consent was obtained.                        - Patient identification and proposed procedure were                         verified prior to the procedure by the nurse. The                         procedure was verified in the procedure room.                        - ASA Grade Assessment: III - A patient with severe                         systemic disease.                        - After reviewing the risks and benefits, the patient                         was deemed in satisfactory condition to undergo the                         procedure.                        After obtaining informed consent, the colonoscope was                         passed under direct vision. Throughout the procedure,  the patient's blood pressure, pulse, and oxygen                         saturations were monitored continuously. The                         Colonoscope was introduced through the anus and                         advanced to the the cecum, identified by appendiceal                          orifice and ileocecal valve. The colonoscopy was                         performed without difficulty. The patient tolerated                         the procedure well. The quality of the bowel                         preparation was good. The ileocecal valve, appendiceal                         orifice, and rectum were photographed. Findings:      The perianal and digital rectal examinations were normal. Pertinent       negatives include normal sphincter tone and no palpable rectal lesions.      Non-bleeding internal hemorrhoids were found during retroflexion. The       hemorrhoids were Grade I (internal hemorrhoids that do not prolapse).      Many small-mouthed diverticula were found in the sigmoid colon. There       was no evidence of diverticular bleeding.      Two semi-pedunculated polyps were found in the transverse colon and       ascending colon. The polyps were 8 to 10 mm in size. These polyps were       removed with a hot snare. Resection and retrieval were complete. To       prevent bleeding after the polypectomy, one hemostatic clip was       successfully placed MR Conditional at the ascending colon polypectomy       site. There was no bleeding during, or at the end, of the procedure.       Estimated blood loss: none.      The exam was otherwise without abnormality. Impression:            - Non-bleeding internal hemorrhoids.                        - Mild diverticulosis in the sigmoid colon. There was                         no evidence of diverticular bleeding.                        - Two 8 to 10 mm polyps in the transverse colon and in                         the  ascending colon, removed with a hot snare.                         Resected and retrieved.                        - The examination was otherwise normal. Recommendation:        - Patient has a contact number available for                         emergencies. The signs and symptoms of potential                          delayed complications were discussed with the patient.                         Return to normal activities tomorrow. Written                         discharge instructions were provided to the patient.                        - Resume previous diet.                        - Continue present medications.                        - Repeat colonoscopy date to be determined after                         pending pathology results are reviewed for                         surveillance based on pathology results.                        - Return to GI office PRN.                        - The findings and recommendations were discussed with                         the patient. Procedure Code(s):     --- Professional ---                        [id number], Colonoscopy, flexible; with removal of                         tumor(s), polyp(s), or other lesion(s) by snare                         technique Diagnosis Code(s):     --- Professional ---                        K57.30, Diverticulosis of large intestine without                         perforation or abscess without bleeding  K63.5, Polyp of colon                        K64.0, First degree hemorrhoids                        Z86.010, Personal history of colonic polyps CPT copyright 2019 American Medical Association. All rights reserved. The codes documented in this report are preliminary and upon coder review may  be revised to meet current compliance requirements. Efrain Sella MD, MD 02/09/2021 8:59:23 AM This report has been signed electronically. Number of Addenda: 0 Note Initiated On: 02/09/2021 8:33 AM Scope Withdrawal Time: 0 hours 11 minutes 36 seconds  Total Procedure Duration: 0 hours 16 minutes 23 seconds  Estimated Blood Loss:  Estimated blood loss: none.      [facility]

## 2021-02-09 NOTE — Interval H&P Note (Signed)
History and Physical Interval Note:  02/09/2021 8:34 AM  Philip Carpenter.  has presented today for surgery, with the diagnosis of (Z86.010) HX OF ADENOMATOUS COLONIC POLYPS.  The various methods of treatment have been discussed with the patient and family. After consideration of risks, benefits and other options for treatment, the patient has consented to  Procedure(s) with comments: COLONOSCOPY (N/A) - IDDM (UNCONTROLLED) as a surgical intervention.  The patient's history has been reviewed, patient examined, no change in status, stable for surgery.  I have reviewed the patient's chart and labs.  Questions were answered to the patient's satisfaction.     [place], [place]

## 2021-02-09 NOTE — Anesthesia Preprocedure Evaluation (Signed)
Anesthesia Evaluation  Patient identified by MRN, date of birth, ID band Patient awake    Reviewed: Allergy & Precautions, NPO status , Patient's Chart, lab work & pertinent test results  History of Anesthesia Complications Negative for: history of anesthetic complications  Airway Mallampati: III  TM Distance: >3 FB Neck ROM: Full    Dental  (+) Teeth Intact   Pulmonary neg sleep apnea, neg COPD, Patient abstained from smoking.Not current smoker,  childhoood asthma   Pulmonary exam normal breath sounds clear to auscultation       Cardiovascular Exercise Tolerance: Good METShypertension, (-) CAD and (-) Past MI (-) dysrhythmias  Rhythm:Regular Rate:Normal - Systolic murmurs    Neuro/Psych CVA, No Residual Symptoms negative psych ROS   GI/Hepatic neg GERD  ,(+)     (-) substance abuse  ,   Endo/Other  diabetes  Renal/GU negative Renal ROS     Musculoskeletal   Abdominal   Peds  Hematology   Anesthesia Other Findings Past Medical History: No date: Asthma No date: Diabetes mellitus without complication (HCC) No date: Hyperlipidemia No date: Hypertension No date: Melanoma of scalp (HCC) No date: Obesity No date: Stroke [facility])  Reproductive/Obstetrics                             Anesthesia Physical Anesthesia Plan  ASA: 3  Anesthesia Plan: General   Post-op Pain Management: Minimal or no pain anticipated   Induction: Intravenous  PONV Risk Score and Plan: 2 and Propofol infusion, TIVA and Ondansetron  Airway Management Planned: Nasal Cannula  Additional Equipment: None  Intra-op Plan:   Post-operative Plan:   Informed Consent: I have reviewed the patients History and Physical, chart, labs and discussed the procedure including the risks, benefits and alternatives for the proposed anesthesia with the patient or authorized representative who has indicated his/her understanding  and acceptance.     Dental advisory given  Plan Discussed with: CRNA and Surgeon  Anesthesia Plan Comments: (Discussed risks of anesthesia with patient, including possibility of difficulty with spontaneous ventilation under anesthesia necessitating airway intervention, PONV, and rare risks such as cardiac or respiratory or neurological events, and allergic reactions. Discussed the role of CRNA in patient's perioperative care. Patient understands.)        Anesthesia Quick Evaluation

## 2021-02-10 ENCOUNTER — Encounter: Payer: Self-pay | Admitting: Internal Medicine

## 2021-02-10 LAB — SURGICAL PATHOLOGY

## 2021-02-11 ENCOUNTER — Ambulatory Visit: Payer: Medicare HMO | Admitting: Physician Assistant

## 2021-11-24 ENCOUNTER — Ambulatory Visit: Payer: Medicare HMO | Admitting: Urology

## 2021-11-24 ENCOUNTER — Telehealth: Payer: Self-pay | Admitting: Family Medicine

## 2021-11-24 ENCOUNTER — Encounter: Payer: Self-pay | Admitting: Urology

## 2021-11-24 VITALS — BP 167/78 | HR 74 | Ht 70.0 in | Wt 246.0 lb

## 2021-11-24 DIAGNOSIS — N3941 Urge incontinence: Secondary | ICD-10-CM

## 2021-11-24 DIAGNOSIS — N3944 Nocturnal enuresis: Secondary | ICD-10-CM

## 2021-11-24 DIAGNOSIS — N401 Enlarged prostate with lower urinary tract symptoms: Secondary | ICD-10-CM

## 2021-11-24 DIAGNOSIS — R350 Frequency of micturition: Secondary | ICD-10-CM

## 2021-11-24 LAB — BLADDER SCAN AMB NON-IMAGING: Scan Result: 11

## 2021-11-24 MED ORDER — GEMTESA 75 MG PO TABS: 1.0000 | ORAL_TABLET | Freq: Every day | ORAL | 0 refills | Status: DC

## 2021-11-24 NOTE — Addendum Note (Signed)
Addended by: Kyra Manges on: 11/24/2021 04:24 PM   Modules accepted: Orders

## 2021-11-24 NOTE — Patient Instructions (Signed)
Call to let us know how the [place] samples working

## 2021-11-24 NOTE — Progress Notes (Signed)
11/24/2021 8:46 AM   Philip Carpenter [birth year] [id number]  Referring provider: Idelle Crouch, MD [facility] [place],  [place] [address]  Chief Complaint  Patient presents with   Urinary Incontinence    HPI: 76 y.o. male presents for follow-up of urinary incontinence.  Previously seen for urinary urgency with urge incontinence.  Initially responded to Myrbetriq however subsequently refractory and medication was cost prohibitive Was placed on trospium which he took for approximately 1 month and did not follow-up and currently only taking tamsulosin and finasteride His wife states he wears diapers during the daytime and has nocturnal enuresis.  Prior history CVA in 2014 No dysuria or gross hematuria Denies flank, abdominal or pelvic pain  PMH: Past Medical History:  Diagnosis Date   Asthma    Diabetes mellitus without complication ([place])    Hyperlipidemia    Hypertension    Melanoma of scalp ([place])    Obesity    Stroke [facility])     Surgical History: Past Surgical History:  Procedure Laterality Date   CHOLECYSTECTOMY     colonoscopy     COLONOSCOPY N/A 02/09/2021   Procedure: COLONOSCOPY;  Surgeon: Toledo, Benay Pike, MD;  Location: [facility] ENDOSCOPY;  Service: Gastroenterology;  Laterality: N/A;  IDDM (UNCONTROLLED)   COLONOSCOPY WITH PROPOFOL N/A 01/29/2018   Procedure: COLONOSCOPY WITH PROPOFOL;  Surgeon: Toledo, Benay Pike, MD;  Location: [facility] ENDOSCOPY;  Service: Gastroenterology;  Laterality: N/A;   right radius fracture repair      Home Medications:  Allergies as of 11/24/2021       Reactions   Metformin Diarrhea        Medication List        Accurate as of November 24, 2021  8:46 AM. If you have any questions, ask your nurse or doctor.          STOP taking these medications    insulin aspart 100 UNIT/ML FlexPen Commonly known as: NOVOLOG Stopped by: Abbie Sons, MD   insulin degludec 200 UNIT/ML FlexTouch  Pen Commonly known as: TRESIBA Stopped by: Abbie Sons, MD   insulin glargine 100 UNIT/ML injection Commonly known as: LANTUS Stopped by: Abbie Sons, MD   Trospium Chloride 60 MG Cp24 Stopped by: Abbie Sons, MD       TAKE these medications    aspirin 325 MG tablet Take by mouth.   atorvastatin 10 MG tablet Commonly known as: LIPITOR Take by mouth.   donepezil 5 MG tablet Commonly known as: ARICEPT Take 5 mg by mouth daily.   finasteride 5 MG tablet Commonly known as: PROSCAR TAKE 1 TABLET (5 MG TOTAL) BY MOUTH DAILY.   Gemtesa 75 MG Tabs Generic drug: Vibegron Take 1 tablet by mouth daily.   glipiZIDE 10 MG tablet Commonly known as: GLUCOTROL Take 10 mg by mouth daily.   metFORMIN 500 MG 24 hr tablet Commonly known as: GLUCOPHAGE-XR Take 500 mg by mouth 2 (two) times daily. What changed: Another medication with the same name was removed. Continue taking this medication, and follow the directions you see here. Changed by: Abbie Sons, MD   Multi-Vitamins Tabs Take by mouth.   OneTouch Verio test strip Generic drug: glucose blood 3 (three) times daily.   ramipril 5 MG capsule Commonly known as: ALTACE Take by mouth.   tamsulosin 0.4 MG Caps capsule Commonly known as: FLOMAX TAKE 1 CAPSULE (0.4 MG TOTAL) BY MOUTH DAILY.  Allergies:  Allergies  Allergen Reactions   Metformin Diarrhea    Family History: Family History  Problem Relation Age of Onset   Prostate cancer Neg Hx    Bladder Cancer Neg Hx    Kidney cancer Neg Hx     Social History:  reports that he has never smoked. He has never used smokeless tobacco. He reports that he does not drink alcohol and does not use drugs.   Physical Exam: BP (!) 167/78   Pulse 74   Ht '5\' 10"'$  (1.778 m)   Wt 246 lb (111.6 kg)   BMI 35.30 kg/m   Constitutional:  Alert, No acute distress. HEENT: [place] AT Respiratory: Normal respiratory effort, no increased work of  breathing. Psychiatric: Normal mood and affect.    Assessment & Plan:   76 y.o. male with BPH, urgency with urge incontinence and nocturnal enuresis Unable to provide a urine today however estimated bladder volume was 11 mL.  He will bring a specimen in later today for urinalysis Trial Gemtesa 75 mg daily-samples given and they will call back regarding efficacy   Abbie Sons, MD  [facility] [address] [place], [place] [address] [phone]

## 2021-11-24 NOTE — Telephone Encounter (Signed)
LMOM for patient to return call. He was suppose to bring a urine back at his office visit today. He will need to set up a time to bring his urine in.

## 2021-11-25 ENCOUNTER — Other Ambulatory Visit: Payer: Medicare HMO

## 2021-11-25 ENCOUNTER — Other Ambulatory Visit: Payer: Self-pay | Admitting: Urology

## 2021-11-25 DIAGNOSIS — N401 Enlarged prostate with lower urinary tract symptoms: Secondary | ICD-10-CM

## 2021-11-25 DIAGNOSIS — N3944 Nocturnal enuresis: Secondary | ICD-10-CM

## 2021-11-25 DIAGNOSIS — N3941 Urge incontinence: Secondary | ICD-10-CM

## 2021-11-25 LAB — URINALYSIS, COMPLETE
Bilirubin, UA: NEGATIVE
Glucose, UA: NEGATIVE
Ketones, UA: NEGATIVE
Nitrite, UA: POSITIVE — AB
Protein,UA: NEGATIVE
RBC, UA: NEGATIVE
Specific Gravity, UA: 1.025 (ref 1.005–1.030)
Urobilinogen, Ur: 0.2 mg/dL (ref 0.2–1.0)
pH, UA: 5 (ref 5.0–7.5)

## 2021-11-25 LAB — MICROSCOPIC EXAMINATION

## 2021-11-29 LAB — CULTURE, URINE COMPREHENSIVE

## 2021-12-01 ENCOUNTER — Other Ambulatory Visit: Payer: Self-pay | Admitting: *Deleted

## 2021-12-01 ENCOUNTER — Telehealth: Payer: Self-pay | Admitting: *Deleted

## 2021-12-01 MED ORDER — SULFAMETHOXAZOLE-TRIMETHOPRIM 800-160 MG PO TABS: 1.0000 | ORAL_TABLET | Freq: Two times a day (BID) | ORAL | 0 refills | Status: DC

## 2021-12-01 NOTE — Telephone Encounter (Signed)
Notified patient wife as instructed, patient pleased. Discussed follow-up appointments, patient agrees

## 2021-12-01 NOTE — Telephone Encounter (Signed)
-----   Message from Abbie Sons, MD sent at 12/01/2021  9:26 AM EDT ----- Urinalysis positive for bacteria.  Please send in Rx Septra DS 1 twice daily x10 days.  Repeat UA follow-up visit next month

## 2021-12-29 ENCOUNTER — Encounter: Payer: Self-pay | Admitting: Urology

## 2021-12-29 ENCOUNTER — Ambulatory Visit (INDEPENDENT_AMBULATORY_CARE_PROVIDER_SITE_OTHER): Payer: Medicare HMO | Admitting: Urology

## 2021-12-29 VITALS — BP 159/72 | HR 60 | Ht 70.0 in | Wt 243.0 lb

## 2021-12-29 DIAGNOSIS — N3944 Nocturnal enuresis: Secondary | ICD-10-CM

## 2021-12-29 DIAGNOSIS — N401 Enlarged prostate with lower urinary tract symptoms: Secondary | ICD-10-CM

## 2021-12-29 DIAGNOSIS — R350 Frequency of micturition: Secondary | ICD-10-CM | POA: Diagnosis not present

## 2021-12-29 LAB — BLADDER SCAN AMB NON-IMAGING: Scan Result: 32

## 2021-12-29 MED ORDER — GEMTESA 75 MG PO TABS: 75.0000 mg | ORAL_TABLET | Freq: Every day | ORAL | 1 refills | Status: DC

## 2021-12-29 MED ORDER — GEMTESA 75 MG PO TABS: 1.0000 | ORAL_TABLET | Freq: Every day | ORAL | 0 refills | Status: DC

## 2021-12-29 NOTE — Progress Notes (Signed)
12/29/2021 1:17 PM   Philip Carpenter 1946/02/11 [id number]  Referring provider: Idelle Crouch, MD [place],  [place] [address]  Chief Complaint  Patient presents with   Benign Prostatic Hypertrophy    HPI: 76 y.o. male presents for 1 month follow-up follow-up   Previously seen for urinary urgency with urge incontinence.  Initially responded to Myrbetriq however subsequently refractory and medication was cost prohibitive Was placed on trospium which he took for approximately 1 month and did not follow-up and currently only taking tamsulosin and finasteride His wife states he wears diapers during the daytime and has nocturnal enuresis. Prior history CVA in 2014; + dementia No dysuria or gross hematuria Denies flank, abdominal or pelvic pain  Has stopped tamsulosin and seeing no worsening of his symptoms His wife states there was significant improvement in his voiding symptoms and incontinence on Gemtesa Urine culture was positive at last visit and he completed a course of Septra DS His wife states he has had a sleep study which was not felt to be valid due to patient removing nasal sensors intermittently.  They offered to schedule a repeat study but wife declined  PMH: Past Medical History:  Diagnosis Date   Asthma    Diabetes mellitus without complication ([place])    Hyperlipidemia    Hypertension    Melanoma of scalp ([place])    Obesity    Stroke [facility])     Surgical History: Past Surgical History:  Procedure Laterality Date   CHOLECYSTECTOMY     colonoscopy     COLONOSCOPY N/A 02/09/2021   Procedure: COLONOSCOPY;  Surgeon: Toledo, Benay Pike, MD;  Location: [facility] ENDOSCOPY;  Service: Gastroenterology;  Laterality: N/A;  IDDM (UNCONTROLLED)   COLONOSCOPY WITH PROPOFOL N/A 01/29/2018   Procedure: COLONOSCOPY WITH PROPOFOL;  Surgeon: Toledo, Benay Pike, MD;  Location: [facility] ENDOSCOPY;  Service: Gastroenterology;  Laterality: N/A;    right radius fracture repair      Home Medications:  Allergies as of 12/29/2021       Reactions   Metformin Diarrhea        Medication List        Accurate as of December 29, 2021  1:17 PM. If you have any questions, ask your nurse or doctor.          STOP taking these medications    sulfamethoxazole-trimethoprim 800-160 MG tablet Commonly known as: BACTRIM DS Stopped by: Abbie Sons, MD   tamsulosin 0.4 MG Caps capsule Commonly known as: FLOMAX Stopped by: Abbie Sons, MD       TAKE these medications    aspirin 325 MG tablet Take by mouth.   atorvastatin 10 MG tablet Commonly known as: LIPITOR Take by mouth.   donepezil 5 MG tablet Commonly known as: ARICEPT Take 5 mg by mouth daily.   finasteride 5 MG tablet Commonly known as: PROSCAR TAKE 1 TABLET (5 MG TOTAL) BY MOUTH DAILY.   Gemtesa 75 MG Tabs Generic drug: Vibegron Take 1 tablet by mouth daily. What changed: Another medication with the same name was added. Make sure you understand how and when to take each. Changed by: Abbie Sons, MD   Gemtesa 75 MG Tabs Generic drug: Vibegron Take 75 mg by mouth daily. What changed: You were already taking a medication with the same name, and this prescription was added. Make sure you understand how and when to take each. Changed by: Abbie Sons, MD  glipiZIDE 10 MG tablet Commonly known as: GLUCOTROL Take 10 mg by mouth daily.   LORazepam 0.5 MG tablet Commonly known as: ATIVAN Take 0.5 mg by mouth every 8 (eight) hours as needed.   metFORMIN 500 MG 24 hr tablet Commonly known as: GLUCOPHAGE-XR Take 500 mg by mouth 2 (two) times daily.   Multi-Vitamins Tabs Take by mouth.   OneTouch Verio test strip Generic drug: glucose blood 3 (three) times daily.   ramipril 5 MG capsule Commonly known as: ALTACE Take by mouth.        Allergies:  Allergies  Allergen Reactions   Metformin Diarrhea    Family History: Family  History  Problem Relation Age of Onset   Prostate cancer Neg Hx    Bladder Cancer Neg Hx    Kidney cancer Neg Hx     Social History:  reports that he has never smoked. He has never used smokeless tobacco. He reports that he does not drink alcohol and does not use drugs.   Physical Exam: BP (!) 159/72   Pulse 60   Ht '5\' 10"'$  (1.778 m)   Wt 243 lb (110.2 kg)   BMI 34.87 kg/m   Constitutional:  Alert, No acute distress. HEENT: [place] AT Respiratory: Normal respiratory effort, no increased work of breathing. Psychiatric: Normal mood and affect.    Assessment & Plan:   PVR today 32 mL Improvement on Gemtesa.  They were given additional samples and Rx sent to pharmacy We discussed cost of [place] is variable with different insurance plans If they are unable to for [place] would consider repeating sleep study as diagnosis of sleep apnea with treatment may improve his nighttime symptoms Schedule 64-monthfollow-up    SAbbie Sons MD  [place] [place] [phone]

## 2022-01-02 ENCOUNTER — Other Ambulatory Visit: Payer: Self-pay | Admitting: Physician Assistant

## 2022-01-02 DIAGNOSIS — Z8673 Personal history of transient ischemic attack (TIA), and cerebral infarction without residual deficits: Secondary | ICD-10-CM

## 2022-01-02 DIAGNOSIS — R413 Other amnesia: Secondary | ICD-10-CM

## 2022-01-11 ENCOUNTER — Ambulatory Visit
Admission: RE | Admit: 2022-01-11 | Discharge: 2022-01-11 | Disposition: A | Discharge status: Home / Self Care | Payer: Medicare HMO | Source: Ambulatory Visit | Attending: Physician Assistant | Admitting: Physician Assistant

## 2022-01-11 DIAGNOSIS — R413 Other amnesia: Secondary | ICD-10-CM | POA: Insufficient documentation

## 2022-01-11 DIAGNOSIS — Z8673 Personal history of transient ischemic attack (TIA), and cerebral infarction without residual deficits: Secondary | ICD-10-CM | POA: Diagnosis present

## 2022-01-26 ENCOUNTER — Telehealth: Payer: Self-pay | Admitting: Family Medicine

## 2022-01-26 NOTE — Telephone Encounter (Signed)
Patient's wife called and states the Philip Carpenter is working well but it costs 100.00 and they can't afford that. She is wondering if there is another medication that he can try?

## 2022-01-27 MED ORDER — TROSPIUM CHLORIDE ER 60 MG PO CP24: 60.0000 mg | ORAL_CAPSULE | Freq: Every day | ORAL | 1 refills | Status: DC

## 2022-01-27 NOTE — Telephone Encounter (Signed)
Can give a trial of trospium 60 mg daily #30.

## 2022-01-27 NOTE — Addendum Note (Signed)
Addended by: Kyra Manges on: 01/27/2022 10:39 AM   Modules accepted: Orders

## 2022-01-30 ENCOUNTER — Telehealth: Payer: Self-pay | Admitting: Family Medicine

## 2022-01-30 ENCOUNTER — Other Ambulatory Visit: Payer: Self-pay | Admitting: Family Medicine

## 2022-01-30 MED ORDER — TROSPIUM CHLORIDE 20 MG PO TABS: 20.0000 mg | ORAL_TABLET | Freq: Two times a day (BID) | ORAL | 2 refills | Status: DC

## 2022-01-30 MED ORDER — TROSPIUM CHLORIDE ER 60 MG PO CP24: 60.0000 mg | ORAL_CAPSULE | Freq: Every day | ORAL | 2 refills | Status: DC

## 2022-01-30 MED ORDER — GEMTESA 75 MG PO TABS: 75.0000 mg | ORAL_TABLET | Freq: Every day | ORAL | 0 refills | Status: DC

## 2022-01-30 NOTE — Telephone Encounter (Signed)
Please check the price on the immediate release trospium 20 mg twice daily.  It may be cheaper than the extended release on good Rx.

## 2022-01-30 NOTE — Addendum Note (Signed)
Addended by: Kyra Manges on: 01/30/2022 04:56 PM   Modules accepted: Orders

## 2022-01-30 NOTE — Telephone Encounter (Signed)
Spoke to patient's wife and she will try to get the '20mg'$ .

## 2022-01-30 NOTE — Telephone Encounter (Signed)
Patient's wife called office today and states the Trospium is still to expensive. I had her come to the office to pick up a goodrx card for the Trospium and the medication is 54.00 at [facility]. I also gave her the assistance card to get [facility] and that is out of their price range as well. She is requesting another medication that may be more cost effective. I did inform her that with his cognitive metal status there may not be another medication that would be ok to take. Do you have another suggestion?

## 2022-02-07 NOTE — Telephone Encounter (Signed)
error 

## 2022-03-10 ENCOUNTER — Other Ambulatory Visit: Payer: Self-pay | Admitting: Internal Medicine

## 2022-03-10 DIAGNOSIS — R4182 Altered mental status, unspecified: Secondary | ICD-10-CM

## 2022-03-24 ENCOUNTER — Ambulatory Visit: Admission: RE | Admit: 2022-03-24 | Payer: Medicare HMO | Source: Ambulatory Visit

## 2022-03-30 ENCOUNTER — Inpatient Hospital Stay
Admission: EM | Admit: 2022-03-30 | Discharge: 2022-04-04 | DRG: 689 | Disposition: A | Discharge status: Skilled Nursing Facility | Payer: Medicare HMO | Attending: Internal Medicine | Admitting: Internal Medicine

## 2022-03-30 ENCOUNTER — Other Ambulatory Visit: Payer: Self-pay

## 2022-03-30 ENCOUNTER — Emergency Department: Payer: Medicare HMO

## 2022-03-30 ENCOUNTER — Inpatient Hospital Stay: Payer: Medicare HMO

## 2022-03-30 DIAGNOSIS — E1165 Type 2 diabetes mellitus with hyperglycemia: Secondary | ICD-10-CM

## 2022-03-30 DIAGNOSIS — B9620 Unspecified Escherichia coli [E. coli] as the cause of diseases classified elsewhere: Secondary | ICD-10-CM | POA: Diagnosis present

## 2022-03-30 DIAGNOSIS — J45909 Unspecified asthma, uncomplicated: Secondary | ICD-10-CM | POA: Diagnosis present

## 2022-03-30 DIAGNOSIS — G9341 Metabolic encephalopathy: Secondary | ICD-10-CM | POA: Diagnosis present

## 2022-03-30 DIAGNOSIS — Z794 Long term (current) use of insulin: Secondary | ICD-10-CM | POA: Diagnosis not present

## 2022-03-30 DIAGNOSIS — R4701 Aphasia: Secondary | ICD-10-CM | POA: Diagnosis present

## 2022-03-30 DIAGNOSIS — I10 Essential (primary) hypertension: Secondary | ICD-10-CM | POA: Diagnosis present

## 2022-03-30 DIAGNOSIS — Z85820 Personal history of malignant melanoma of skin: Secondary | ICD-10-CM

## 2022-03-30 DIAGNOSIS — Z7984 Long term (current) use of oral hypoglycemic drugs: Secondary | ICD-10-CM

## 2022-03-30 DIAGNOSIS — E785 Hyperlipidemia, unspecified: Secondary | ICD-10-CM | POA: Diagnosis present

## 2022-03-30 DIAGNOSIS — Z8673 Personal history of transient ischemic attack (TIA), and cerebral infarction without residual deficits: Secondary | ICD-10-CM | POA: Diagnosis not present

## 2022-03-30 DIAGNOSIS — R001 Bradycardia, unspecified: Secondary | ICD-10-CM | POA: Diagnosis present

## 2022-03-30 DIAGNOSIS — G9340 Encephalopathy, unspecified: Secondary | ICD-10-CM | POA: Diagnosis not present

## 2022-03-30 DIAGNOSIS — N3090 Cystitis, unspecified without hematuria: Secondary | ICD-10-CM | POA: Diagnosis not present

## 2022-03-30 DIAGNOSIS — Z6831 Body mass index (BMI) 31.0-31.9, adult: Secondary | ICD-10-CM

## 2022-03-30 DIAGNOSIS — E669 Obesity, unspecified: Secondary | ICD-10-CM | POA: Diagnosis present

## 2022-03-30 DIAGNOSIS — E119 Type 2 diabetes mellitus without complications: Secondary | ICD-10-CM

## 2022-03-30 DIAGNOSIS — Z1623 Resistance to quinolones and fluoroquinolones: Secondary | ICD-10-CM | POA: Diagnosis present

## 2022-03-30 DIAGNOSIS — Z1611 Resistance to penicillins: Secondary | ICD-10-CM | POA: Diagnosis present

## 2022-03-30 DIAGNOSIS — Z79899 Other long term (current) drug therapy: Secondary | ICD-10-CM

## 2022-03-30 DIAGNOSIS — Z7982 Long term (current) use of aspirin: Secondary | ICD-10-CM

## 2022-03-30 DIAGNOSIS — F03918 Unspecified dementia, unspecified severity, with other behavioral disturbance: Secondary | ICD-10-CM | POA: Diagnosis present

## 2022-03-30 DIAGNOSIS — N390 Urinary tract infection, site not specified: Principal | ICD-10-CM | POA: Diagnosis present

## 2022-03-30 DIAGNOSIS — R627 Adult failure to thrive: Secondary | ICD-10-CM | POA: Diagnosis present

## 2022-03-30 DIAGNOSIS — Z1152 Encounter for screening for COVID-19: Secondary | ICD-10-CM

## 2022-03-30 LAB — COMPREHENSIVE METABOLIC PANEL
ALT: 31 U/L (ref 0–44)
AST: 36 U/L (ref 15–41)
Albumin: 3.7 g/dL (ref 3.5–5.0)
Alkaline Phosphatase: 53 U/L (ref 38–126)
Anion gap: 15 (ref 5–15)
BUN: 25 mg/dL — ABNORMAL HIGH (ref 8–23)
CO2: 23 mmol/L (ref 22–32)
Calcium: 9 mg/dL (ref 8.9–10.3)
Chloride: 104 mmol/L (ref 98–111)
Creatinine, Ser: 0.8 mg/dL (ref 0.61–1.24)
GFR, Estimated: >60 mL/min (ref >60)
Glucose, Bld: 134 mg/dL — ABNORMAL HIGH (ref 70–99)
Potassium: 3.8 mmol/L (ref 3.5–5.1)
Sodium: 142 mmol/L (ref 135–145)
Total Bilirubin: 2 mg/dL — ABNORMAL HIGH (ref 0.3–1.2)
Total Protein: 7.1 g/dL (ref 6.5–8.1)

## 2022-03-30 LAB — URINALYSIS, ROUTINE W REFLEX MICROSCOPIC
Bilirubin Urine: NEGATIVE
Glucose, UA: NEGATIVE mg/dL
Ketones, ur: 20 mg/dL — AB
Nitrite: NEGATIVE
Protein, ur: 100 mg/dL — AB
Specific Gravity, Urine: 1.023 (ref 1.005–1.030)
pH: 5 (ref 5.0–8.0)

## 2022-03-30 LAB — CBC WITH DIFFERENTIAL/PLATELET
Abs Immature Granulocytes: 0.07 10*3/uL (ref 0.00–0.07)
Basophils Absolute: 0 10*3/uL (ref 0.0–0.1)
Basophils Relative: 0 %
Eosinophils Absolute: 0 10*3/uL (ref 0.0–0.5)
Eosinophils Relative: 0 %
HCT: 44.1 % (ref 39.0–52.0)
Hemoglobin: 14.4 g/dL (ref 13.0–17.0)
Immature Granulocytes: 1 %
Lymphocytes Relative: 12 %
Lymphs Abs: 1.4 10*3/uL (ref 0.7–4.0)
MCH: 32.1 pg (ref 26.0–34.0)
MCHC: 32.7 g/dL (ref 30.0–36.0)
MCV: 98.4 fL (ref 80.0–100.0)
Monocytes Absolute: 0.6 10*3/uL (ref 0.1–1.0)
Monocytes Relative: 6 %
Neutro Abs: 9 10*3/uL — ABNORMAL HIGH (ref 1.7–7.7)
Neutrophils Relative %: 81 %
Platelets: 307 10*3/uL (ref 150–400)
RBC: 4.48 MIL/uL (ref 4.22–5.81)
RDW: 13.5 % (ref 11.5–15.5)
WBC: 11.1 10*3/uL — ABNORMAL HIGH (ref 4.0–10.5)
nRBC: 0 % (ref 0.0–0.2)

## 2022-03-30 LAB — RESP PANEL BY RT-PCR (RSV, FLU A&B, COVID)  RVPGX2
Influenza A by PCR: NEGATIVE
Influenza B by PCR: NEGATIVE
Resp Syncytial Virus by PCR: NEGATIVE
SARS Coronavirus 2 by RT PCR: NEGATIVE

## 2022-03-30 LAB — MAGNESIUM: Magnesium: 2 mg/dL (ref 1.7–2.4)

## 2022-03-30 LAB — HEMOGLOBIN A1C
Hgb A1c MFr Bld: 5.6 % (ref 4.8–5.6)
Mean Plasma Glucose: 114.02 mg/dL

## 2022-03-30 LAB — GLUCOSE, CAPILLARY
Glucose-Capillary: 147 mg/dL — ABNORMAL HIGH (ref 70–99)
Glucose-Capillary: 142 mg/dL — ABNORMAL HIGH (ref 70–99)

## 2022-03-30 LAB — TSH: TSH: 1.548 u[IU]/mL (ref 0.350–4.500)

## 2022-03-30 MED ORDER — INSULIN ASPART 100 UNIT/ML IJ SOLN
0.0000 [IU] | Freq: Three times a day (TID) | INTRAMUSCULAR | Status: DC
  Administered 2022-03-31 (×2): 3 [IU] via SUBCUTANEOUS
  Administered 2022-03-31: 2 [IU] via SUBCUTANEOUS
  Administered 2022-04-01 (×2): 3 [IU] via SUBCUTANEOUS
  Administered 2022-04-01: 2 [IU] via SUBCUTANEOUS
  Administered 2022-04-02 (×2): 3 [IU] via SUBCUTANEOUS
  Administered 2022-04-02: 1 [IU] via SUBCUTANEOUS
  Administered 2022-04-03: 3 [IU] via SUBCUTANEOUS
  Administered 2022-04-03: 2 [IU] via SUBCUTANEOUS
  Administered 2022-04-03 – 2022-04-04 (×3): 3 [IU] via SUBCUTANEOUS
  Administered 2022-04-04: 2 [IU] via SUBCUTANEOUS
  Filled 2022-03-30 (×14): qty 1

## 2022-03-30 MED ORDER — SODIUM CHLORIDE 0.9 % IV SOLN
1.0000 g | INTRAVENOUS | Status: AC
  Administered 2022-03-31 – 2022-04-01 (×2): 1 g via INTRAVENOUS
  Filled 2022-03-30 (×2): qty 10

## 2022-03-30 MED ORDER — TAMSULOSIN HCL 0.4 MG PO CAPS
0.4000 mg | ORAL_CAPSULE | Freq: Every day | ORAL | Status: DC
  Administered 2022-03-30 – 2022-04-04 (×6): 0.4 mg via ORAL
  Filled 2022-03-30 (×6): qty 1

## 2022-03-30 MED ORDER — HYDRALAZINE HCL 25 MG PO TABS
25.0000 mg | ORAL_TABLET | Freq: Three times a day (TID) | ORAL | Status: DC
  Administered 2022-03-30 – 2022-04-04 (×16): 25 mg via ORAL
  Filled 2022-03-30 (×17): qty 1

## 2022-03-30 MED ORDER — DIVALPROEX SODIUM 125 MG PO DR TAB
125.0000 mg | DELAYED_RELEASE_TABLET | Freq: Two times a day (BID) | ORAL | Status: DC
  Administered 2022-03-30 – 2022-04-04 (×10): 125 mg via ORAL
  Filled 2022-03-30 (×12): qty 1

## 2022-03-30 MED ORDER — ENOXAPARIN SODIUM 60 MG/0.6ML IJ SOSY
0.5000 mg/kg | PREFILLED_SYRINGE | INTRAMUSCULAR | Status: DC
  Administered 2022-03-31 – 2022-04-04 (×4): 50 mg via SUBCUTANEOUS
  Filled 2022-03-30 (×4): qty 0.6

## 2022-03-30 MED ORDER — ONDANSETRON HCL 4 MG/2ML IJ SOLN
4.0000 mg | Freq: Once | INTRAMUSCULAR | Status: AC
  Administered 2022-03-30: 4 mg via INTRAVENOUS
  Filled 2022-03-30: qty 2

## 2022-03-30 MED ORDER — ATORVASTATIN CALCIUM 10 MG PO TABS
10.0000 mg | ORAL_TABLET | Freq: Every day | ORAL | Status: DC
  Administered 2022-03-31 – 2022-04-04 (×5): 10 mg via ORAL
  Filled 2022-03-30 (×6): qty 1

## 2022-03-30 MED ORDER — ONDANSETRON HCL 4 MG/2ML IJ SOLN
4.0000 mg | Freq: Four times a day (QID) | INTRAMUSCULAR | Status: DC | PRN
  Administered 2022-03-30: 4 mg via INTRAVENOUS
  Filled 2022-03-30: qty 2

## 2022-03-30 MED ORDER — FINASTERIDE 5 MG PO TABS
5.0000 mg | ORAL_TABLET | Freq: Every day | ORAL | Status: DC
  Administered 2022-03-31 – 2022-04-04 (×5): 5 mg via ORAL
  Filled 2022-03-30 (×6): qty 1

## 2022-03-30 MED ORDER — SODIUM CHLORIDE 0.9 % IV BOLUS
1000.0000 mL | Freq: Once | INTRAVENOUS | Status: AC
  Administered 2022-03-30: 1000 mL via INTRAVENOUS

## 2022-03-30 MED ORDER — SODIUM CHLORIDE 0.9 % IV SOLN
1.0000 g | Freq: Once | INTRAVENOUS | Status: AC
  Administered 2022-03-30: 1 g via INTRAVENOUS
  Filled 2022-03-30: qty 10

## 2022-03-30 MED ORDER — DONEPEZIL HCL 5 MG PO TABS
10.0000 mg | ORAL_TABLET | Freq: Every day | ORAL | Status: DC
  Administered 2022-03-30 – 2022-04-04 (×5): 10 mg via ORAL
  Filled 2022-03-30 (×6): qty 2

## 2022-03-30 MED ORDER — ASPIRIN 325 MG PO TABS
325.0000 mg | ORAL_TABLET | Freq: Every day | ORAL | Status: DC
  Administered 2022-03-31 – 2022-04-02 (×3): 325 mg via ORAL
  Filled 2022-03-30 (×3): qty 1

## 2022-03-30 MED ORDER — RAMIPRIL 5 MG PO CAPS
5.0000 mg | ORAL_CAPSULE | Freq: Every day | ORAL | Status: DC
  Administered 2022-03-30 – 2022-04-04 (×6): 5 mg via ORAL
  Filled 2022-03-30 (×7): qty 1

## 2022-03-30 MED ORDER — INSULIN ASPART 100 UNIT/ML IJ SOLN
0.0000 [IU] | Freq: Every day | INTRAMUSCULAR | Status: DC
  Administered 2022-04-03: 2 [IU] via SUBCUTANEOUS
  Filled 2022-03-30: qty 1

## 2022-03-30 MED ORDER — SODIUM CHLORIDE 0.45 % IV SOLN: INTRAVENOUS | Status: DC

## 2022-03-30 NOTE — ED Provider Notes (Signed)
[facility] Provider Note    Event Date/Time   First MD Initiated Contact with Patient 03/30/22 1042     (approximate)   History   Chief Complaint: Weakness   HPI  Philip Carpenter. is a 77 y.o. male with a history of hypertension diabetes stroke who was brought to the ED due to generalized weakness vomiting and inability to eat or drink or take his medicines for the past 3 days.  Patient has increased confusion as well.  Patient denies any pain.  No falls or trauma.     Physical Exam   Triage Vital Signs: ED Triage Vitals [03/30/22 1040]  Enc Vitals Group     BP      Pulse      Resp      Temp      Temp src      SpO2      Weight 220 lb 8 oz (100 kg)     Height      Head Circumference      Peak Flow      Pain Score      Pain Loc      Pain Edu?      Excl. in [place]?     Most recent vital signs: Vitals:   03/30/22 1130 03/30/22 1159  BP: (!) 169/51 (!) 169/51  Pulse: (!) 48 (!) 49  Resp: 16 13  Temp: (!) 100.4 F (38 C)   SpO2: 92% 98%    General: Awake, no distress.  CV:  Good peripheral perfusion.  Regular rate and rhythm Resp:  Normal effort.  Clear to auscultation bilaterally Abd:  No distention.  Soft nontender. Other:  Genitalia excoriated, no crepitus, no inflammatory changes.  Micropenis.   ED Results / Procedures / Treatments   Labs (all labs ordered are listed, but only abnormal results are displayed) Labs Reviewed  CBC WITH DIFFERENTIAL/PLATELET - Abnormal; Notable for the following components:      Result Value   WBC 11.1 (*)    Neutro Abs 9.0 (*)    All other components within normal limits  URINALYSIS, ROUTINE W REFLEX MICROSCOPIC - Abnormal; Notable for the following components:   Color, Urine YELLOW (*)    APPearance CLOUDY (*)    Hgb urine dipstick SMALL (*)    Ketones, ur 20 (*)    Protein, ur 100 (*)    Leukocytes,Ua SMALL (*)    Bacteria, UA MANY (*)    All other components within normal limits  RESP  PANEL BY RT-PCR (RSV, FLU A&B, COVID)  [id number]  URINE CULTURE  COMPREHENSIVE METABOLIC PANEL     EKG Interpreted by me Sinus rhythm rate of 52.  Normal axis, normal intervals.  Normal QRS ST segments and T waves.   RADIOLOGY Chest x-ray interpreted by me, appears unremarkable.  Radiology report reviewed   PROCEDURES:  Procedures   MEDICATIONS ORDERED IN ED: Medications  cefTRIAXone (ROCEPHIN) 1 g in sodium chloride 0.9 % 100 mL IVPB (1 g Intravenous New Bag/Given 03/30/22 1153)  sodium chloride 0.9 % bolus 1,000 mL (1,000 mLs Intravenous New Bag/Given 03/30/22 1126)  ondansetron (ZOFRAN) injection 4 mg (4 mg Intravenous Given 03/30/22 1126)     IMPRESSION / MDM / ASSESSMENT AND PLAN / ED COURSE  I reviewed the triage vital signs and the nursing notes.  DDx: Viral illness, dehydration, electrolyte abnormality, aspiration pneumonia, hyperglycemia, UTI  Patient's presentation is most consistent with acute presentation with potential threat  to life or bodily function.  Patient presents with vomiting and decreased oral intake.  Appears somewhat dehydrated.  Will give IV fluids and Zofran.  Check labs.   ----------------------------------------- 12:03 PM on 03/30/2022 ----------------------------------------- UA consistent with UTI.  Patient has a low-grade fever but is not septic.  Rocephin ordered for UTI.  Case discussed with hospitalist for further management due to age, comorbidities, and acute encephalopathy.Marland Kitchen      FINAL CLINICAL IMPRESSION(S) / ED DIAGNOSES   Final diagnoses:  Cystitis  Acute encephalopathy  Type 2 diabetes mellitus without complication, without long-term current use of insulin ([place])     Rx / DC Orders   ED Discharge Orders     None        Note:  This document was prepared using Dragon voice recognition software and may include unintentional dictation errors.   Carrie Mew, MD 03/30/22 [phone]

## 2022-03-30 NOTE — ED Notes (Signed)
Vitals stable, pt headed up stairs

## 2022-03-30 NOTE — ED Notes (Signed)
Pt desatted to 85% in his sleep, he was placed on 2L [place] and is now satting at 100%.

## 2022-03-30 NOTE — Consult Note (Signed)
[place] CARDIOLOGY CONSULT NOTE       Patient ID: Philip Carpenter. MRN: [id number] DOB/AGE: [birth year] 77 y.o.  Admit date: 03/30/2022 Referring Physician Dr. Lucienne Minks Primary Physician Dr. Fulton Reek Primary Cardiologist Dr. Saralyn Pilar Reason for Consultation bradycardia  HPI: [name]. [name] is a 73yoM with a PMH of HTN, HLD, DM2, hx CVA (2014), exertional dyspnea with recent lexiscan myoview 11/2021 (EF 58%, mild inferior scar w/o ischemia), memory impairment who presented to [facility] ED 03/30/22 with generalized weakness, vomiting, poor PO intake and confusion x 3 days. Noted to be bradycardic to 48bpm on presentation for which cardiology is consulted for further assistance.   He was referred to Dr. Saralyn Pilar in September 2023 from his PCP for marked exertional dyspnea without any chest pain. Underwent lexiscan myoview which revealed normal EF 50%, mild inferior scar without evidence of ischemia.  He was advised to follow-up in 3 months.  He presents today with his wife who provides the entirety of the history.  She states that the patient has been generally declining since September 2023.  He has lost 24 pounds between September and December unintentionally, his wife states that he just is not hungry does not really eat very much throughout the day, despite her encouragement.  He has had ongoing issues with his memory, sometimes he does not recognize his sister, know his birthday needs significant encouragement/reminders to brush his teeth, and perform other ADLs.  His wife does not think the patient has complained of any chest pain, heart racing or palpitations, denies any dizziness or lightheadedness, or peripheral edema.  His wife states he fell out of bed once recently, but was able to get himself back up into bed unassisted, she thinks he rolled off the side of the bed and denies any true syncopal or presyncopal episodes.  He is chronically short of breath with exertion  which appears no worse lately.  At my time relation of the patient's sleep, laying on his right side and ED stretcher.  He awakens easily to voice and denies complaints, heart rate increases from 48 bpm to the 50s while he is awake.  Over the past 4 days the patient has had markedly reduced p.o. intake, spitting up any food his wife offers him. The patient's wife thinks he has only eaten 1/2 of a peanut butter cracker and 2 peppermint candies over the past several days.  His wife brought him to the emergency department for this reason. On arrival, he was bradycardic to ~48bpm on tele in normal sinus rhythm, febrile to 100.4. Otherwise labs are notable for leukocytosis to 11 K, potassium 3.8, slightly elevated T. bili at 2.0, BUN/Cr 25 and 0.80 and GFR >60.  UA with small leukocytes, proteinuria ketonuria with culture pending.  Review of systems complete and found to be negative unless listed above   Past Medical History:  Diagnosis Date   Asthma    Diabetes mellitus without complication ([place])    Hyperlipidemia    Hypertension    Melanoma of scalp ([place])    Obesity    Stroke [facility])     Past Surgical History:  Procedure Laterality Date   CHOLECYSTECTOMY     colonoscopy     COLONOSCOPY N/A 02/09/2021   Procedure: COLONOSCOPY;  Surgeon: Toledo, Benay Pike, MD;  Location: [facility] ENDOSCOPY;  Service: Gastroenterology;  Laterality: N/A;  IDDM (UNCONTROLLED)   COLONOSCOPY WITH PROPOFOL N/A 01/29/2018   Procedure: COLONOSCOPY WITH PROPOFOL;  Surgeon: Alice Reichert, Benay Pike, MD;  Location: [facility] ENDOSCOPY;  Service: Gastroenterology;  Laterality: N/A;   right radius fracture repair      (Not in a hospital admission)  Social History   Socioeconomic History   Marital status: Married    Spouse name: Not on file   Number of children: Not on file   Years of education: Not on file   Highest education level: Not on file  Occupational History   Not on file  Tobacco Use   Smoking status: Never   Smokeless  tobacco: Never  Vaping Use   Vaping Use: Never used  Substance and Sexual Activity   Alcohol use: No   Drug use: No   Sexual activity: Not on file  Other Topics Concern   Not on file  Social History Narrative   Not on file   Social Determinants of Health   Financial Resource Strain: Not on file  Food Insecurity: Not on file  Transportation Needs: Not on file  Physical Activity: Not on file  Stress: Not on file  Social Connections: Not on file  Intimate Partner Violence: Not on file    Family History  Problem Relation Age of Onset   Prostate cancer Neg Hx    Bladder Cancer Neg Hx    Kidney cancer Neg Hx      No intake or output data in the 24 hours ending 03/30/22 1316  Vitals:   03/30/22 1100 03/30/22 1130 03/30/22 1159 03/30/22 1238  BP: (!) 179/74 (!) 169/51 (!) 169/51 137/87  Pulse: (!) 51 (!) 48 (!) 49 (!) 49  Resp: '10 16 13 14  '$ Temp:  (!) 100.4 F (38 C)    TempSrc:  Oral    SpO2: 96% 92% 98% 92%  Weight:        PHYSICAL EXAM General: elderly caucasian male , well nourished, in no acute distress. Laying on right side in ED stretcher, wife at bedside comfortably resting awakens easily to voice. HEENT:  Normocephalic and atraumatic. Neck:  No JVD.  Lungs: Normal respiratory effort on room air. Clear bilaterally to auscultation. No wheezes, crackles, rhonchi.  Heart: Bradycardic but regular. Normal S1 and S2 without gallops or murmurs.  Abdomen: Non-distended appearing with excess adiposity.  Msk: Normal strength and tone for age. Extremities: Warm and well perfused. No clubbing, cyanosis.  No peripheral edema.  Neuro: fatigued, oriented to self but not place, time, or situation Psych:  Answers simple questions  Labs: Basic Metabolic Panel: Recent Labs    03/30/22 1110  NA 142  K 3.8  CL 104  CO2 23  GLUCOSE 134*  BUN 25*  CREATININE 0.80  CALCIUM 9.0   Liver Function Tests: Recent Labs    03/30/22 1110  AST 36  ALT 31  ALKPHOS 53  BILITOT  2.0*  PROT 7.1  ALBUMIN 3.7   No results for input(s): "LIPASE", "AMYLASE" in the last 72 hours. CBC: Recent Labs    03/30/22 1110  WBC 11.1*  NEUTROABS 9.0*  HGB 14.4  HCT 44.1  MCV 98.4  PLT 307   Cardiac Enzymes: No results for input(s): "CKTOTAL", "CKMB", "CKMBINDEX", "TROPONINIHS" in the last 72 hours. BNP: No results for input(s): "BNP" in the last 72 hours. D-Dimer: No results for input(s): "DDIMER" in the last 72 hours. Hemoglobin A1C: No results for input(s): "HGBA1C" in the last 72 hours. Fasting Lipid Panel: No results for input(s): "CHOL", "HDL", "LDLCALC", "TRIG", "CHOLHDL", "LDLDIRECT" in the last 72 hours. Thyroid Function Tests: No results for input(s): "TSH", "  T4TOTAL", "T3FREE", "THYROIDAB" in the last 72 hours.  Invalid input(s): "FREET3" Anemia Panel: No results for input(s): "VITAMINB12", "FOLATE", "FERRITIN", "TIBC", "IRON", "RETICCTPCT" in the last 72 hours.   Radiology: DG Chest Portable 1 View  Result Date: 03/30/2022 CLINICAL DATA:  Vomiting.  Weakness. EXAM: PORTABLE CHEST 1 VIEW COMPARISON:  None FINDINGS: No pleural effusion. No pneumothorax. Enlarged cardiac contours. No focal airspace opacity. No radiographically apparent displaced rib fractures. Degenerative changes of the right glenohumeral and AC joint. IMPRESSION: Mild cardiomegaly.  No focal airspace opacity. Electronically Signed   By: Marin Roberts M.D.   On: 03/30/2022 11:48    ECHO 04/2021 INTERPRETATION  NORMAL LEFT VENTRICULAR SYSTOLIC FUNCTION  NORMAL RIGHT VENTRICULAR SYSTOLIC FUNCTION  MILD VALVULAR REGURGITATION (See above)  NO VALVULAR STENOSIS   TELEMETRY reviewed by me (LT) 03/30/2022 : sinus brady rates 47 bpm, increase while patient is awake during interview briefly to 50s-low 60s  EKG reviewed by me: sinus bradycardia rate 48 bpm  Data reviewed by me (LT) 03/30/2022: last cardiology clinic note 11/2021, ed note, cbc, bmp, vitals, tele, cxr, ua   Principal Problem:   UTI  (urinary tract infection)    ASSESSMENT AND PLAN:  Ivery Nanney. Lexie Morini is a 45yoM with a PMH of HTN, HLD, DM2, hx CVA (2014), exertional dyspnea with recent lexiscan myoview 11/2021 (EF 58%, mild inferior scar w/o ischemia), memory impairment who presented to [facility] ED 03/30/22 with generalized weakness, vomiting, poor PO intake and confusion x 3 days. Noted to be bradycardic to 48bpm on presentation for which cardiology is consulted for further assistance.   # acute cystitis # failure to thrive # asymptomatic sinus bradycardia Wife reports the patient has experienced a ~24lb weight loss since 10/2021 and ongoing/worsening issues with memory loss (forgetting his birthday, not recognizing family members) + increasing dependence on his wife for ADLs. Presents with 4 days of minimal PO intake and one episode of diarrhea. Febrile to 100.4 on presentation, UA concerning for acute cystitis. In sinus brady to 48bpm on tele while sleeping, rate increases appropriately to 50s-60s when awake, not on AV nodal blockers, electrolytes WNL.  -Agree with current therapy for treatment of cystitis -Recommend continuous monitoring on telemetry while inpatient -Optimize electrolytes.  K >4, mag >2 -Check TSH, was 1.77 in January 2024 -Avoid any AV nodal blockers, BB, CCB -No evidence of symptomatic bradycardia, high-grade AV block, or sick sinus syndrome on telemetry so far.  No indication for temporary transvenous pacemaker or permanent pacemaker at this time.  This patient's plan of care was discussed and created with Dr. Saralyn Pilar and he is in agreement.  Signed: Tristan Schroeder , PA-C 03/30/2022, 1:16 PM [facility] Cardiology

## 2022-03-30 NOTE — ED Notes (Addendum)
Dr. Joni Fears informed that the pts temp is 100.4 and HR is 50bpm

## 2022-03-30 NOTE — ED Triage Notes (Signed)
EMS states they were called out for sick person; wife reported patient has been vomiting, weak and recently incontinent of urine. Patient has not had any po meds in three days.

## 2022-03-30 NOTE — H&P (Signed)
History and Physical    Patient: Philip Carpenter. OTL:[id number] DOB: 10/18/1945 DOA: 03/30/2022 DOS: the patient was seen and examined on 03/30/2022 PCP: Idelle Crouch, MD  Patient coming from: Home  Chief Complaint:  Chief Complaint  Patient presents with   Weakness   HPI: Philip Carpenter. is a 77 y.o. male who presents to the hospital due to urinary frequency and failure to thrive at home. Sister provides the history as pt has baseline dementia (per the sister). Sister reports that ever since last Friday the pt has not been able to ambulate very well and has been a lot more lethargic recently. Dr. Doy Hutching the PCP advised a visit to the ER. In addition, pt has not been taking his home medications (other than insulin). Sister reports that he has not been eating and drinking as he normally does. Pt endorses urinary frequency, but no foul smelling urine and no bloody urine.  Workup in the ER revealed leukocytosis and a UTI on UA. As a result, the pt will be admitted for further management.   He is also noted to have bradycardia in the 48-49 range (which appears to be new). Cardiology was consulted 03/30/2022 (Dr. Saralyn Pilar)   Review of Systems: As mentioned in the history of present illness. All other systems reviewed and are negative. Past Medical History:  Diagnosis Date   Asthma    Diabetes mellitus without complication ([place])    Hyperlipidemia    Hypertension    Melanoma of scalp ([place])    Obesity    Stroke [facility])    Past Surgical History:  Procedure Laterality Date   CHOLECYSTECTOMY     colonoscopy     COLONOSCOPY N/A 02/09/2021   Procedure: COLONOSCOPY;  Surgeon: Toledo, Benay Pike, MD;  Location: [facility] ENDOSCOPY;  Service: Gastroenterology;  Laterality: N/A;  IDDM (UNCONTROLLED)   COLONOSCOPY WITH PROPOFOL N/A 01/29/2018   Procedure: COLONOSCOPY WITH PROPOFOL;  Surgeon: Toledo, Benay Pike, MD;  Location: [facility] ENDOSCOPY;  Service: Gastroenterology;  Laterality: N/A;   right  radius fracture repair     Social History:  reports that he has never smoked. He has never used smokeless tobacco. He reports that he does not drink alcohol and does not use drugs.  Allergies  Allergen Reactions   Metformin Diarrhea    Family History  Problem Relation Age of Onset   Prostate cancer Neg Hx    Bladder Cancer Neg Hx    Kidney cancer Neg Hx     Prior to Admission medications   Medication Sig Start Date End Date Taking? Authorizing Provider  aspirin 325 MG tablet Take by mouth.   Yes [provider]  atorvastatin (LIPITOR) 10 MG tablet Take by mouth. 09/06/15  Yes [provider]  divalproex (DEPAKOTE) 125 MG DR tablet Take 125 mg by mouth as directed. 03/16/22  Yes [provider]  finasteride (PROSCAR) 5 MG tablet TAKE 1 TABLET (5 MG TOTAL) BY MOUTH DAILY. 01/20/19  Yes Stoioff, Ronda Fairly, MD  glipiZIDE (GLUCOTROL) 10 MG tablet Take 10 mg by mouth daily. 01/05/21  Yes [provider]  LORazepam (ATIVAN) 0.5 MG tablet Take 0.5 mg by mouth every 8 (eight) hours as needed. 12/14/21  Yes [provider]  ramipril (ALTACE) 5 MG capsule Take by mouth. 05/20/20  Yes [provider]  sertraline (ZOLOFT) 25 MG tablet Take 0.5 tablets by mouth daily. 03/26/22  Yes [provider]  tamsulosin (FLOMAX) 0.4 MG CAPS capsule Take 0.4 mg  by mouth daily. 01/28/22  Yes [provider]  trospium (SANCTURA) 20 MG tablet Take 1 tablet (20 mg total) by mouth 2 (two) times daily. 01/30/22  Yes Stoioff, Ronda Fairly, MD  donepezil (ARICEPT) 5 MG tablet Take 5 mg by mouth daily. 11/11/21   [provider]  metFORMIN (GLUCOPHAGE-XR) 500 MG 24 hr tablet Take 500 mg by mouth 2 (two) times daily. 11/11/21   [provider]  Multiple Vitamin (MULTI-VITAMINS) TABS Take by mouth.    [provider]  [facility] VERIO test strip 3 (three) times daily. 08/10/21   [provider]    Physical Exam: Vitals:    03/30/22 1100 03/30/22 1130 03/30/22 1159 03/30/22 1238  BP: (!) 179/74 (!) 169/51 (!) 169/51 137/87  Pulse: (!) 51 (!) 48 (!) 49 (!) 49  Resp: '10 16 13 14  '$ Temp:  (!) 100.4 F (38 C)    TempSrc:  Oral    SpO2: 96% 92% 98% 92%  Weight:       Physical Exam Constitutional:      Appearance: Normal appearance.  HENT:     Head: Normocephalic and atraumatic.     Mouth/Throat:     Mouth: Mucous membranes are moist.  Cardiovascular:     Rate and Rhythm: Bradycardia present.  Pulmonary:     Effort: Pulmonary effort is normal.     Breath sounds: Normal breath sounds.  Abdominal:     General: Abdomen is flat.     Palpations: Abdomen is soft.  Musculoskeletal:        General: Normal range of motion.     Cervical back: Neck supple.  Skin:    General: Skin is warm and dry.  Neurological:     Mental Status: He is alert.     Comments: Awake and answers questions, oriented x 0   Psychiatric:        Mood and Affect: Mood normal.     Data Reviewed:  There are no new results to review at this time.  Assessment and Plan: UTI  - IV ceftriaxone 1 g daily  - IV 1/2 NS 75 cc/hr  - F/u urine cx  - Flomax 0.4 mg PO daily  - Proscar 5 mg PO daily   Bradycardia  - Cardiology Dr. Saralyn Pilar consulted 03/30/2022  Debility  - PT/OT   HTN - Ramipril 5 mg PO daily   5. DM  - Novolog SS ACHS  - ASA 325 mg PO daily  - Lipitor 10 mg PO daily   6. Dementia  - Aricept 5 mg PO daily  - Complicating care   DVT prophylaxis: Lovenox 50 mg sq daily   Consults: Cardiology 03/30/2022  Family Communication: Sister at bedside in agreement with plan for admission   Severity of Illness: The appropriate patient status for this patient is INPATIENT. Inpatient status is judged to be reasonable and necessary in order to provide the required intensity of service to ensure the patient's safety. The patient's presenting symptoms, physical exam findings, and initial radiographic and laboratory data in  the context of their chronic comorbidities is felt to place them at high risk for further clinical deterioration. Furthermore, it is not anticipated that the patient will be medically stable for discharge from the hospital within 2 midnights of admission.   * I certify that at the point of admission it is my clinical judgment that the patient will require inpatient hospital care spanning beyond 2 midnights from the point of admission due  to high intensity of service, high risk for further deterioration and high frequency of surveillance required.*  Author: Lucienne Minks , MD 03/30/2022 12:49 PM  For on call review [email or web address].

## 2022-03-31 DIAGNOSIS — G9340 Encephalopathy, unspecified: Secondary | ICD-10-CM | POA: Diagnosis not present

## 2022-03-31 LAB — COMPREHENSIVE METABOLIC PANEL
ALT: 27 U/L (ref 0–44)
AST: 21 U/L (ref 15–41)
Albumin: 3.4 g/dL — ABNORMAL LOW (ref 3.5–5.0)
Alkaline Phosphatase: 50 U/L (ref 38–126)
Anion gap: 9 (ref 5–15)
BUN: 24 mg/dL — ABNORMAL HIGH (ref 8–23)
CO2: 30 mmol/L (ref 22–32)
Calcium: 8.6 mg/dL — ABNORMAL LOW (ref 8.9–10.3)
Chloride: 103 mmol/L (ref 98–111)
Creatinine, Ser: 0.82 mg/dL (ref 0.61–1.24)
GFR, Estimated: >60 mL/min (ref >60)
Glucose, Bld: 159 mg/dL — ABNORMAL HIGH (ref 70–99)
Potassium: 3.4 mmol/L — ABNORMAL LOW (ref 3.5–5.1)
Sodium: 142 mmol/L (ref 135–145)
Total Bilirubin: 1.2 mg/dL (ref 0.3–1.2)
Total Protein: 6.7 g/dL (ref 6.5–8.1)

## 2022-03-31 LAB — CBC
HCT: 40.9 % (ref 39.0–52.0)
Hemoglobin: 13.2 g/dL (ref 13.0–17.0)
MCH: 31.9 pg (ref 26.0–34.0)
MCHC: 32.3 g/dL (ref 30.0–36.0)
MCV: 98.8 fL (ref 80.0–100.0)
Platelets: 276 10*3/uL (ref 150–400)
RBC: 4.14 MIL/uL — ABNORMAL LOW (ref 4.22–5.81)
RDW: 13.6 % (ref 11.5–15.5)
WBC: 10.7 10*3/uL — ABNORMAL HIGH (ref 4.0–10.5)
nRBC: 0 % (ref 0.0–0.2)

## 2022-03-31 LAB — GLUCOSE, CAPILLARY
Glucose-Capillary: 154 mg/dL — ABNORMAL HIGH (ref 70–99)
Glucose-Capillary: 216 mg/dL — ABNORMAL HIGH (ref 70–99)
Glucose-Capillary: 227 mg/dL — ABNORMAL HIGH (ref 70–99)
Glucose-Capillary: 149 mg/dL — ABNORMAL HIGH (ref 70–99)

## 2022-03-31 LAB — AMMONIA: Ammonia: <10 umol/L (ref 9–35)

## 2022-03-31 LAB — PHOSPHORUS: Phosphorus: 3.8 mg/dL (ref 2.5–4.6)

## 2022-03-31 LAB — MAGNESIUM: Magnesium: 2 mg/dL (ref 1.7–2.4)

## 2022-03-31 LAB — C-REACTIVE PROTEIN: CRP: 1.2 mg/dL — ABNORMAL HIGH (ref <1.0)

## 2022-03-31 MED ORDER — POTASSIUM CHLORIDE 10 MEQ/100ML IV SOLN
10.0000 meq | INTRAVENOUS | Status: AC
  Administered 2022-03-31 (×4): 10 meq via INTRAVENOUS
  Filled 2022-03-31 (×4): qty 100

## 2022-03-31 NOTE — TOC Initial Note (Signed)
Transition of Care (TOC) - Initial/Assessment Note    Patient Details  Name: Philip Carpenter. MRN: [id number] Date of Birth: 03-20-1945  Transition of Care [facility]) CM/SW Contact:    Laurena Slimmer, RN Phone Number: 03/31/2022, 3:37 PM  Clinical Narrative:                 Spoke with patient's wife Peggy regarding discharge plans for SNF. She is agreeable to SNF, and would prefer [facility].  [place] PASSR obtained FL2 completed Bed search started         Patient Goals and CMS Choice            Expected Discharge Plan and Services                                              Prior Living Arrangements/Services                       Activities of Daily Living Home Assistive Devices/Equipment: Cane (specify quad or straight) ADL Screening (condition at time of admission) Patient's cognitive ability adequate to safely complete daily activities?: No Is the patient deaf or have difficulty hearing?: No Does the patient have difficulty seeing, even when wearing glasses/contacts?: Yes Does the patient have difficulty concentrating, remembering, or making decisions?: Yes Patient able to express need for assistance with ADLs?: No Does the patient have difficulty dressing or bathing?: Yes Independently performs ADLs?: No Communication: Needs assistance Is this a change from baseline?: Pre-admission baseline Dressing (OT): Needs assistance Is this a change from baseline?: Pre-admission baseline Grooming: Needs assistance Is this a change from baseline?: Pre-admission baseline Feeding: Needs assistance Is this a change from baseline?: Pre-admission baseline Bathing: Needs assistance Is this a change from baseline?: Pre-admission baseline Toileting: Needs assistance Is this a change from baseline?: Pre-admission baseline In/Out Bed: Needs assistance Is this a change from baseline?: Pre-admission baseline Walks in Home: Needs assistance Is this a change  from baseline?: Pre-admission baseline Does the patient have difficulty walking or climbing stairs?: Yes Weakness of Legs: Both Weakness of Arms/Hands: None  Permission Sought/Granted                  Emotional Assessment              Admission diagnosis:  UTI (urinary tract infection) [N39.0] Acute encephalopathy [G93.40] Cystitis [N30.90] Type 2 diabetes mellitus without complication, without long-term current use of insulin ([place]) [E11.9] Patient Active Problem List   Diagnosis Date Noted   UTI (urinary tract infection) 03/30/2022   Acute encephalopathy 03/30/2022   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   Prostate cancer screening 01/25/2016   Enlarged prostate with lower urinary tract symptoms (LUTS) 01/25/2016   Morbid obesity with BMI of 45.0-49.9, adult ([place]) 04/27/2015   Uncontrolled type 2 diabetes mellitus with hyperglycemia, with long-term current use of insulin ([place]) 04/27/2015   Diabetes ([place]) 08/18/2013   DOE (dyspnea on exertion) 08/18/2013   HTN (hypertension) 08/18/2013   Hyperlipidemia, unspecified 08/18/2013   Obesity 08/18/2013   PCP:  Idelle Crouch, MD Pharmacy:   [place], [place] - [address] [place] [address] Phone: [phone] Fax: [phone]  [facility] [place], [address] [address] Phone: [phone] Fax:  [place] [id number], [place] [address] Phone: [phone] Fax: [phone]  [place], [place] - [address] [place] [address] Phone: [phone] Fax: [phone]     Social Determinants of Health (SDOH) Social History: SDOH Screenings   Food Insecurity: No Food Insecurity (03/30/2022)  Housing: Low Risk  (03/30/2022)  Transportation Needs: No Transportation Needs (03/30/2022)   Utilities: Not At Risk (03/30/2022)  Tobacco Use: Low Risk  (03/30/2022)   SDOH Interventions:     Readmission Risk Interventions     No data to display

## 2022-03-31 NOTE — Evaluation (Signed)
Physical Therapy Evaluation Patient Details Name: Philip Carpenter. MRN: [id number] DOB: 09/10/1945 Today's Date: 03/31/2022  History of Present Illness  Philip Carpenter. is a 77 y.o. male who presents to the hospital due to urinary frequency and failure to thrive at home. Sister provides the history as pt has baseline dementia (per the sister). Sister reports that ever since last Friday the pt has not been able to ambulate very well and has been a lot more lethargic recently. Dr. Doy Hutching the PCP advised a visit to the ER. In addition, pt has not been taking his home medications (other than insulin). Sister reports that he has not been eating and drinking as he normally does. Pt endorses urinary frequency, but no foul smelling urine and no bloody urine.  Workup in the ER revealed leukocytosis and a UTI on UA. As a result, the pt will be admitted for further management.      He is also noted to have bradycardia in the 48-49 range (which appears to be new). Cardiology was consulted 03/30/2022  Clinical Impression  Pt received in bed with a sitter in room. Pt alert and oriented to self only. Pt agreeable to PT evaluation. BP in supine 115/60, in sitting 105/69 and in  standing 88/41 HR >90 through put the session with SPO2 100% with report of dizziness. Pt unsteady on feet with stepping. Pt performed Bed mobility with mod assist and transfers with Mod assist with FWW. PT deferred pt from gait 2/2 to low BP. PT will continue in acute care. More in formation regarding pt's PLOF needed. PT attempted twice but unable to reach family. Pt will benefit form SNF.      Recommendations for follow up therapy are one component of a multi-disciplinary discharge planning process, led by the attending physician.  Recommendations may be updated based on patient status, additional functional criteria and insurance authorization.  Follow Up Recommendations Skilled nursing-short term rehab (<3 hours/day) Can patient  physically be transported by private vehicle: No    Assistance Recommended at Discharge Frequent or constant Supervision/Assistance  Patient can return home with the following  A lot of help with walking and/or transfers;A lot of help with bathing/dressing/bathroom;Assistance with cooking/housework;Assistance with feeding;Direct supervision/assist for medications management;Assist for transportation;Help with stairs or ramp for entrance    Equipment Recommendations None recommended by PT  Recommendations for Other Services       Functional Status Assessment Patient has had a recent decline in their functional status and demonstrates the ability to make significant improvements in function in a reasonable and predictable amount of time.     Precautions / Restrictions Precautions Precautions: Fall Restrictions Weight Bearing Restrictions: No      Mobility  Bed Mobility Overal bed mobility: Needs Assistance Bed Mobility: Supine to Sit     Supine to sit: Mod assist          Transfers Overall transfer level: Needs assistance Equipment used: Rolling walker (2 wheels) Transfers: Sit to/from Stand, Bed to chair/wheelchair/BSC Sit to Stand: Mod assist   Step pivot transfers: Mod assist       General transfer comment: due to difficulty sequencing and unsteady on feet    Ambulation/Gait               General Gait Details: deferred 2/2 to unsteady on feet.  Stairs            Wheelchair Mobility    Modified Rankin (Stroke Patients Only)  Balance Overall balance assessment: Needs assistance Sitting-balance support: Feet supported, Bilateral upper extremity supported Sitting balance-Leahy Scale: Fair     Standing balance support: Bilateral upper extremity supported Standing balance-Leahy Scale: Poor Standing balance comment: unsteady with FWW.                             Pertinent Vitals/Pain Pain Assessment Pain Assessment:  No/denies pain    Home Living Family/patient expects to be discharged to:: Unsure Living Arrangements: Spouse/significant other                 Additional Comments: unable to reach family    Prior Function Prior Level of Function : Other (comment)             Mobility Comments: unable retrive information ADLs Comments: Unknown at this point     Hand Dominance        Extremity/Trunk Assessment   Upper Extremity Assessment Upper Extremity Assessment: Defer to OT evaluation    Lower Extremity Assessment Lower Extremity Assessment: Generalized weakness       Communication   Communication:  (pt is confused)  Cognition Arousal/Alertness: Awake/alert Behavior During Therapy: WFL for tasks assessed/performed Overall Cognitive Status: Difficult to assess                                 General Comments: oriented to self.        General Comments      Exercises     Assessment/Plan    PT Assessment Patient needs continued PT services  PT Problem List Decreased strength;Decreased activity tolerance;Decreased balance;Decreased coordination;Decreased cognition;Decreased knowledge of use of DME;Decreased safety awareness;Impaired tone       PT Treatment Interventions Gait training;Functional mobility training;Therapeutic activities;Therapeutic exercise;Balance training;Neuromuscular re-education;Cognitive remediation;Patient/family education    PT Goals (Current goals can be found in the Care Plan section)  Acute Rehab PT Goals Patient Stated Goal: unable PT Goal Formulation: Patient unable to participate in goal setting Time For Goal Achievement: 04/14/22 Potential to Achieve Goals: Fair    Frequency Min 2X/week     Co-evaluation               AM-PAC PT "6 Clicks" Mobility  Outcome Measure Help needed turning from your back to your side while in a flat bed without using bedrails?: A Lot Help needed moving from lying on your back  to sitting on the side of a flat bed without using bedrails?: A Lot Help needed moving to and from a bed to a chair (including a wheelchair)?: A Lot Help needed standing up from a chair using your arms (e.g., wheelchair or bedside chair)?: A Lot Help needed to walk in hospital room?: A Lot Help needed climbing 3-5 steps with a railing? : Total 6 Click Score: 11    End of Session Equipment Utilized During Treatment: Gait belt Activity Tolerance: Patient tolerated treatment well Patient left: in chair;with call bell/phone within reach;with chair alarm set;with nursing/sitter in room Nurse Communication: Mobility status PT Visit Diagnosis: Unsteadiness on feet (R26.81);Muscle weakness (generalized) (M62.81);Dizziness and giddiness (R42);Difficulty in walking, not elsewhere classified (R26.2)    Time: 1540-0867 PT Time Calculation (min) (ACUTE ONLY): 22 min   Charges:   PT Evaluation $PT Eval Low Complexity: 1 Low PT Treatments $Therapeutic Activity: 8-22 mins       Joaquin Music PT DPT 11:47 AM,03/31/22

## 2022-03-31 NOTE — Progress Notes (Signed)
OT Cancellation Note  Patient Details Name: Philip Carpenter. MRN: [id number] DOB: 1945/03/09   Cancelled Treatment:    Reason Eval/Treat Not Completed: Other (comment). OT order received. Chart reviewed. Upon arrival to unit, RN requests hold this AM as pt is pending AM assessment/medications. Will hold and re-attempt at a later time this date as available and pt medically appropriate for OT evaluation.   Shara Blazing, M.S., OTR/L 03/31/22, 10:13 AM

## 2022-03-31 NOTE — Evaluation (Signed)
Occupational Therapy Evaluation Patient Details Name: Philip Carpenter. MRN: [id number] DOB: 03-11-1945 Today's Date: 03/31/2022   History of Present Illness Philip Uy. is a 78 y.o. male who presents to the hospital due to urinary frequency and failure to thrive at home. Sister provides the history as pt has baseline dementia (per the sister). Sister reports that ever since last Friday the pt has not been able to ambulate very well and has been a lot more lethargic recently. Dr. Doy Hutching the PCP advised a visit to the ER. In addition, pt has not been taking his home medications (other than insulin). Sister reports that he has not been eating and drinking as he normally does. Pt endorses urinary frequency, but no foul smelling urine and no bloody urine.  Workup in the ER revealed leukocytosis and a UTI on UA. As a result, the pt will be admitted for further management.      He is also noted to have bradycardia in the 48-49 range (which appears to be new). Cardiology was consulted 03/30/2022   Clinical Impression   Philip Carpenter was seen for OT evaluation this date. Prior to hospital admission, pt required at least some assistance from his spouse for bathing, dressing, and IADL management 2/2 baseline dementia. Pt lives with his spouse and daughter in a 1 level home with 2 STE. Pt presents to acute OT demonstrating impaired ADL performance and functional mobility 2/2 generalized weakness, decreased activity tolerance, and decreased cognition (See OT problem list). Pt currently requires MIN A for functional mobility and toilet transfers and MOD A for LB ADL management from STS. Pt would benefit from skilled OT services to address noted impairments and functional limitations (see below for any additional details) in order to maximize safety and independence while minimizing falls risk and caregiver burden. Upon hospital discharge, recommend HHOT to maximize pt safety and return to functional independence  during meaningful occupations of daily life.        Recommendations for follow up therapy are one component of a multi-disciplinary discharge planning process, led by the attending physician.  Recommendations may be updated based on patient status, additional functional criteria and insurance authorization.   Follow Up Recommendations  Home health OT     Assistance Recommended at Discharge Frequent or constant Supervision/Assistance  Patient can return home with the following A little help with walking and/or transfers;A lot of help with bathing/dressing/bathroom;Assistance with cooking/housework;Help with stairs or ramp for entrance;Assist for transportation    Functional Status Assessment  Patient has had a recent decline in their functional status and demonstrates the ability to make significant improvements in function in a reasonable and predictable amount of time.  Equipment Recommendations  None recommended by OT    Recommendations for Other Services       Precautions / Restrictions Precautions Precautions: Fall Restrictions Weight Bearing Restrictions: No      Mobility Bed Mobility               General bed mobility comments: deferred. Pt in recliner at start/end of session.    Transfers Overall transfer level: Needs assistance Equipment used: Rolling walker (2 wheels) Transfers: Sit to/from Stand, Bed to chair/wheelchair/BSC Sit to Stand: Mod assist, Min assist     Step pivot transfers: Min assist            Balance Overall balance assessment: Needs assistance Sitting-balance support: Feet supported, Bilateral upper extremity supported Sitting balance-Leahy Scale: Fair     Standing balance  support: Bilateral upper extremity supported, Reliant on assistive device for balance, During functional activity Standing balance-Leahy Scale: Fair Standing balance comment: reliant on device for balance. Has difficulty sequencing mobility with 2WW                            ADL either performed or assessed with clinical judgement   ADL Overall ADL's : Needs assistance/impaired                                       General ADL Comments: MIN A for STS, toilet transfer, toileting. Anticipate MOD A for LB ADL management 2/2 decreased cognition, generalized weakness, and decreased activity tolerance.     Vision Baseline Vision/History: 1 Wears glasses Patient Visual Report: No change from baseline       Perception     Praxis      Pertinent Vitals/Pain Pain Assessment Pain Assessment: No/denies pain     Hand Dominance     Extremity/Trunk Assessment Upper Extremity Assessment Upper Extremity Assessment: Generalized weakness   Lower Extremity Assessment Lower Extremity Assessment: Generalized weakness       Communication Communication Communication: No difficulties   Cognition Arousal/Alertness: Awake/alert Behavior During Therapy: WFL for tasks assessed/performed Overall Cognitive Status: History of cognitive impairments - at baseline                                 General Comments: Per spouse pt cognition is improving from time of admission, however, not yet back to baseline.     General Comments       Exercises Other Exercises Other Exercises: PT/spouse educated on role of OT in acute setting, safe use of AE/DME for ADL management, DC recs, routines modifications to support safety and functional independence with ADL management.   Shoulder Instructions      Home Living Family/patient expects to be discharged to:: Private residence Living Arrangements: Spouse/significant other Available Help at Discharge: Family;Available 24 hours/day Type of Home: House Home Access: Stairs to enter [employer] of Steps: 2 Entrance Stairs-Rails: Right Home Layout: One level     Bathroom Shower/Tub: Teacher, early years/pre: Standard     Home Equipment: Chartered certified accountant (2 wheels);BSC/3in1;Hand held shower head;Shower seat          Prior Functioning/Environment Prior Level of Function : Needs assist;History of Falls (last six months)  Cognitive Assist : ADLs (cognitive);Mobility (cognitive) Mobility (Cognitive): Intermittent cues ADLs (Cognitive): Intermittent cues Physical Assist : ADLs (physical);Mobility (physical) Mobility (physical): Transfers ADLs (physical): Bathing;Grooming Mobility Comments: Per spouse pt uses walking stick and 4WW for functional mobility. has had one fall out of bed in last 6 months. ADLs Comments: Pt requires some assistance with bathing, dressing, and grooming from family 2/2 baseline cognitive impairments.        OT Problem List: Decreased strength;Decreased coordination;Decreased cognition;Decreased activity tolerance;Decreased safety awareness;Impaired balance (sitting and/or standing);Decreased knowledge of use of DME or AE      OT Treatment/Interventions: Self-care/ADL training;Therapeutic activities;Therapeutic exercise;DME and/or AE instruction;Patient/family education;Balance training;Cognitive remediation/compensation    OT Goals(Current goals can be found in the care plan section) Acute Rehab OT Goals Patient Stated Goal: To feel better OT Goal Formulation: With patient/family Time For Goal Achievement: 04/14/22 Potential to Achieve Goals: Good ADL Goals Pt Will Perform  Grooming: sitting;with set-up;with supervision Pt Will Perform Lower Body Dressing: sit to/from stand;with min assist;with caregiver independent in assisting Pt Will Transfer to Toilet: bedside commode;with set-up;with supervision;ambulating;regular height toilet Pt Will Perform Toileting - Clothing Manipulation and hygiene: sit to/from stand;with supervision;with set-up;with caregiver independent in assisting;with adaptive equipment  OT Frequency: Min 2X/week    Co-evaluation              AM-PAC OT "6 Clicks" Daily Activity      Outcome Measure Help from another person eating meals?: A Little Help from another person taking care of personal grooming?: A Little Help from another person toileting, which includes using toliet, bedpan, or urinal?: A Little Help from another person bathing (including washing, rinsing, drying)?: A Little Help from another person to put on and taking off regular upper body clothing?: A Little Help from another person to put on and taking off regular lower body clothing?: A Lot 6 Click Score: 17   End of Session Equipment Utilized During Treatment: Gait belt;Rolling walker (2 wheels) Nurse Communication: Mobility status  Activity Tolerance: Patient tolerated treatment well Patient left: in chair;with call bell/phone within reach;with chair alarm set;with family/visitor present  OT Visit Diagnosis: Other abnormalities of gait and mobility (R26.89);Muscle weakness (generalized) (M62.81);Other symptoms and signs involving cognitive function                Time: 9024-0973 OT Time Calculation (min): 26 min Charges:  OT General Charges $OT Visit: 1 Visit OT Evaluation $OT Eval Moderate Complexity: 1 Mod OT Treatments $Self Care/Home Management : 8-22 mins  Shara Blazing, M.S., OTR/L 03/31/22, 3:43 PM

## 2022-03-31 NOTE — NC FL2 (Signed)
[place] LEVEL OF CARE FORM     IDENTIFICATION  Patient Name: Philip Carpenter. Birthdate: 04/02/1945 Sex: male Admission Date (Current Location): 03/30/2022  [facility] and [place] Number:  Engineering geologist and Address:  [facility], [address] [place], [place] [address]      Provider Number: [id number]  Attending Physician Name and Address:  Lucienne Minks, MD  Relative Name and Phone Number:  Vickii Chafe IEPPI,[phone]    Current Level of Care: Hospital Recommended Level of Care: [place] Prior Approval Number:    Date Approved/Denied:   PASRR Number: [id number]  Discharge Plan: SNF    Current Diagnoses: Patient Active Problem List   Diagnosis Date Noted   UTI (urinary tract infection) 03/30/2022   Acute encephalopathy 03/30/2022   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   Prostate cancer screening 01/25/2016   Enlarged prostate with lower urinary tract symptoms (LUTS) 01/25/2016   Morbid obesity with BMI of 45.0-49.9, adult ([place]) 04/27/2015   Uncontrolled type 2 diabetes mellitus with hyperglycemia, with long-term current use of insulin ([place]) 04/27/2015   Diabetes ([place]) 08/18/2013   DOE (dyspnea on exertion) 08/18/2013   HTN (hypertension) 08/18/2013   Hyperlipidemia, unspecified 08/18/2013   Obesity 08/18/2013    Orientation RESPIRATION BLADDER Height & Weight     Self  Normal External catheter Weight: 100 kg Height:     BEHAVIORAL SYMPTOMS/MOOD NEUROLOGICAL BOWEL NUTRITION STATUS  Other (Comment) (n/a)  (n/a) Continent Diet (Carb modified)  AMBULATORY STATUS COMMUNICATION OF NEEDS Skin   Limited Assist Verbally Normal                       Personal Care Assistance Level of Assistance  Bathing, Feeding, Dressing Bathing Assistance: Limited assistance Feeding assistance: Limited assistance Dressing Assistance: Limited assistance     Functional Limitations Info             SPECIAL CARE  FACTORS FREQUENCY  PT (By licensed PT), OT (By licensed OT)     PT Frequency: Min 2x weekly OT Frequency: Min 2x weekly            Contractures      Additional Factors Info  Code Status, Allergies Code Status Info: FULL Allergies Info: Metformin           Current Medications (03/31/2022):  This is the current hospital active medication list Current Facility-Administered Medications  Medication Dose Route Frequency Provider Last Rate Last Admin   0.45 % sodium chloride infusion   Intravenous Continuous Lucienne Minks, MD   Stopped at 03/31/22 1400   aspirin tablet 325 mg  325 mg Oral Daily Lucienne Minks, MD   325 mg at 03/31/22 2355   atorvastatin (LIPITOR) tablet 10 mg  10 mg Oral Daily Lucienne Minks, MD   10 mg at 03/31/22 7322   cefTRIAXone (ROCEPHIN) 1 g in sodium chloride 0.9 % 100 mL IVPB  1 g Intravenous Q24H Lucienne Minks, MD 200 mL/hr at 03/31/22 1400 1 g at 03/31/22 1400   divalproex (DEPAKOTE) DR tablet 125 mg  125 mg Oral Q12H Lucienne Minks, MD   125 mg at 03/31/22 0936   donepezil (ARICEPT) tablet 10 mg  10 mg Oral QHS Lucienne Minks, MD   10 mg at 03/30/22 2141   enoxaparin (LOVENOX) injection 50 mg  0.5 mg/kg Subcutaneous Q24H Lucienne Minks, MD       finasteride (PROSCAR) tablet 5 mg  5 mg Oral Daily Lucienne Minks,  MD   5 mg at 03/31/22 5035   hydrALAZINE (APRESOLINE) tablet 25 mg  25 mg Oral Q8H Sira, Toula Moos, MD   25 mg at 03/31/22 1401   insulin aspart (novoLOG) injection 0-5 Units  0-5 Units Subcutaneous QHS Sira, Zackery, MD       insulin aspart (novoLOG) injection 0-9 Units  0-9 Units Subcutaneous TID WC Lucienne Minks, MD   3 Units at 03/31/22 1147   ondansetron (ZOFRAN) injection 4 mg  4 mg Intravenous Q6H PRN Lucienne Minks, MD   4 mg at 03/30/22 1627   ramipril (ALTACE) capsule 5 mg  5 mg Oral Daily Lucienne Minks, MD   5 mg at 03/31/22 4656   tamsulosin (FLOMAX) capsule 0.4 mg  0.4 mg Oral Daily Lucienne Minks, MD   0.4 mg at 03/31/22 8127     Discharge  Medications: Please see discharge summary for a list of discharge medications.  Relevant Imaging Results:  Relevant Lab Results:   Additional Information SS# [id number]  Laurena Slimmer, RN

## 2022-03-31 NOTE — Progress Notes (Signed)
   03/31/22 1000  Spiritual Encounters  Type of Visit Initial  Care provided to: Pt and family  Conversation partners present during encounter Nurse  Referral source Family  Reason for visit Advance directives  OnCall Visit Yes  Spiritual Framework  Presenting Themes Goals in life/care  Interventions  Spiritual Care Interventions Made Decision-making support/facilitation  Intervention Outcomes  Outcomes Awareness of support  Spiritual Care Plan  Spiritual Care Issues Still Outstanding No further spiritual care needs at this time (see row info)   [place] visited with the pt and family at the bedside. Pt wife asked for a clarification on the levels of care reflected on the living will. Chap explained different aspects of the Advance Directive and their implications. Wife said pt had filled one sometimes back and she will make it available to the doctors soonest. Pt and family was grateful for the visit.

## 2022-03-31 NOTE — Progress Notes (Signed)
       CROSS COVER NOTE  NAME: Melo Stauber. MRN: [id number] DOB : 01-29-1946     HPI/Events of Note   Nurse reports patient agitated and with altered mental status  Assessment and  Interventions   Assessment: Review of chart. History of stroke and recent evaluation in 12/2021 with neurology for memory loss Admitted for altered mental status and UTI No initial CT head Patient cooperative alert, oriented to self only for me. Face symmetrical, tongue midline. MAE equally not necessarily to command. Definite short term memory problems and aphasia Plan: CT head negative for acute findings Review of neuro assessment findings he appears likely advancing with with his memory loss as may be exacerbated by UTI Neuro checks every [place] NP [facility]

## 2022-03-31 NOTE — Progress Notes (Signed)
[place] [facility] CONSULT NOTE       Patient ID: Philip Carpenter. MRN: [id number] DOB/AGE: [birth year] 77 y.o.  Admit date: 03/30/2022 Referring Physician Dr. Lucienne Minks Primary Physician Dr. Fulton Reek Primary Cardiologist Dr. Saralyn Pilar Reason for Consultation bradycardia  HPI: Philip Carpenter. Philip Carpenter is a 77yoM with a PMH of HTN, HLD, DM2, hx CVA (2014), exertional dyspnea with recent lexiscan myoview 11/2021 (EF 58%, mild inferior scar w/o ischemia), memory impairment who presented to [facility] ED 03/30/22 with generalized weakness, vomiting, poor PO intake and confusion x 3 days. Noted to be bradycardic to 48bpm on presentation for which cardiology is consulted for further assistance. On tele remains in predominantly sinus bradycardia, asymptomatic, hemodynamically stable, with appropriate HR increase with alertness/activity to NSR in 60s.  Interval History:  - reportedly agitated/more confused overnight. Head CT non acute - seen sitting up working with PT, some transient dizziness with position changes but HR / BP stable. No chest pain, shortness of breath  - in sinus brady on tele, rate 48-49 overnight, no evidence of arrhythmia, high grade AV block, or SSS  Review of systems complete and found to be negative unless listed above   Past Medical History:  Diagnosis Date   Asthma    Diabetes mellitus without complication ([name])    Hyperlipidemia    Hypertension    Melanoma of scalp ([place])    Obesity    Stroke [facility])     Past Surgical History:  Procedure Laterality Date   CHOLECYSTECTOMY     colonoscopy     COLONOSCOPY N/A 02/09/2021   Procedure: COLONOSCOPY;  Surgeon: Toledo, Benay Pike, MD;  Location: [facility] ENDOSCOPY;  Service: Gastroenterology;  Laterality: N/A;  IDDM (UNCONTROLLED)   COLONOSCOPY WITH PROPOFOL N/A 01/29/2018   Procedure: COLONOSCOPY WITH PROPOFOL;  Surgeon: Toledo, Benay Pike, MD;  Location: [facility] ENDOSCOPY;  Service: Gastroenterology;  Laterality: N/A;    right radius fracture repair      Medications Prior to Admission  Medication Sig Dispense Refill Last Dose   aspirin 325 MG tablet Take 325 mg by mouth daily.   03/26/2022   atorvastatin (LIPITOR) 10 MG tablet Take 10 mg by mouth daily.   03/26/2022   divalproex (DEPAKOTE) 125 MG DR tablet Take 125 mg by mouth as directed.   03/26/2022   finasteride (PROSCAR) 5 MG tablet TAKE 1 TABLET (5 MG TOTAL) BY MOUTH DAILY. 90 tablet 3 03/26/2022   glipiZIDE (GLUCOTROL) 10 MG tablet Take 10 mg by mouth daily.   03/26/2022   Insulin Glargine (BASAGLAR KWIKPEN) 100 UNIT/ML Inject 40 Units into the skin at bedtime.   03/29/2022   insulin lispro (HUMALOG) 100 UNIT/ML injection Inject 0-100 Units into the skin 3 (three) times daily before meals.   unknown   LORazepam (ATIVAN) 0.5 MG tablet Take 0.5 mg by mouth every 8 (eight) hours as needed.   unknown   metFORMIN (GLUCOPHAGE-XR) 500 MG 24 hr tablet Take 500 mg by mouth daily with breakfast.   03/26/2022   modafinil (PROVIGIL) 100 MG tablet Take 100 mg by mouth daily.   03/26/2022   ramipril (ALTACE) 5 MG capsule Take 5 mg by mouth daily.   03/26/2022   sertraline (ZOLOFT) 25 MG tablet Take 25 mg by mouth daily.   03/26/2022   tamsulosin (FLOMAX) 0.4 MG CAPS capsule Take 0.4 mg by mouth daily.   03/26/2022   trospium (SANCTURA) 20 MG tablet Take 1 tablet (20 mg total) by mouth 2 (two) times daily.  60 tablet 2 03/26/2022   donepezil (ARICEPT) 10 MG tablet Take 10 mg by mouth daily.   03/26/2022   Multiple Vitamin (MULTI-VITAMINS) TABS Take by mouth. (Patient not taking: Reported on 03/30/2022)   Not Taking   ONETOUCH VERIO test strip 3 (three) times daily.       Social History   Socioeconomic History   Marital status: Married    Spouse name: Not on file   Number of children: Not on file   Years of education: Not on file   Highest education level: Not on file  Occupational History   Not on file  Tobacco Use   Smoking status: Never   Smokeless tobacco: Never   Vaping Use   Vaping Use: Never used  Substance and Sexual Activity   Alcohol use: No   Drug use: No   Sexual activity: Not on file  Other Topics Concern   Not on file  Social History Narrative   Not on file   Social Determinants of Health   Financial Resource Strain: Not on file  Food Insecurity: No Food Insecurity (03/30/2022)   Hunger Vital Sign    Worried About Running Out of Food in the Last Year: Never true    Ran Out of Food in the Last Year: Never true  Transportation Needs: No Transportation Needs (03/30/2022)   PRAPARE - Hydrologist (Medical): No    Lack of Transportation (Non-Medical): No  Physical Activity: Not on file  Stress: Not on file  Social Connections: Not on file  Intimate Partner Violence: Not At Risk (03/30/2022)   Humiliation, Afraid, Rape, and Kick questionnaire    Fear of Current or Ex-Partner: No    Emotionally Abused: No    Physically Abused: No    Sexually Abused: No    Family History  Problem Relation Age of Onset   Prostate cancer Neg Hx    Bladder Cancer Neg Hx    Kidney cancer Neg Hx      No intake or output data in the 24 hours ending 03/31/22 0837  Vitals:   03/30/22 1720 03/30/22 1937 03/31/22 0703 03/31/22 0800  BP: 117/75 (!) 152/45 (!) 152/50   Pulse: (!) 48 94 (!) 58   Resp: '16 18 16 16  '$ Temp: 98 F (36.7 C) 97.9 F (36.6 C) 98.3 F (36.8 C)   TempSrc:   Axillary   SpO2: (!) 89% 100% 98%   Weight:        PHYSICAL EXAM General: elderly caucasian male , well nourished, in no acute distress. Sitting upright in bed with PT at bedside. HEENT:  Normocephalic and atraumatic. Neck:  No JVD.  Lungs: Normal respiratory effort on room air. Clear bilaterally to auscultation. No wheezes, crackles, rhonchi.  Heart: Bradycardic but regular. Normal S1 and S2 without gallops or murmurs.  Abdomen: Non-distended appearing with excess adiposity.  Msk: Normal strength and tone for age. Extremities: Warm and well  perfused. No clubbing, cyanosis.  No peripheral edema.  Neuro: awake and alert, not oriented to place, time, or situation Psych:  Answers simple questions  Labs: Basic Metabolic Panel: Recent Labs    03/30/22 1110 03/30/22 1330 03/31/22 0606  NA 142  --  142  K 3.8  --  3.4*  CL 104  --  103  CO2 23  --  30  GLUCOSE 134*  --  159*  BUN 25*  --  24*  CREATININE 0.80  --  0.82  CALCIUM 9.0  --  8.6*  MG  --  2.0 2.0  PHOS  --   --  3.8    Liver Function Tests: Recent Labs    03/30/22 1110 03/31/22 0606  AST 36 21  ALT 31 27  ALKPHOS 53 50  BILITOT 2.0* 1.2  PROT 7.1 6.7  ALBUMIN 3.7 3.4*    No results for input(s): "LIPASE", "AMYLASE" in the last 72 hours. CBC: Recent Labs    03/30/22 1110 03/31/22 0606  WBC 11.1* 10.7*  NEUTROABS 9.0*  --   HGB 14.4 13.2  HCT 44.1 40.9  MCV 98.4 98.8  PLT 307 276    Cardiac Enzymes: No results for input(s): "CKTOTAL", "CKMB", "CKMBINDEX", "TROPONINIHS" in the last 72 hours. BNP: No results for input(s): "BNP" in the last 72 hours. D-Dimer: No results for input(s): "DDIMER" in the last 72 hours. Hemoglobin A1C: Recent Labs    03/30/22 1110  HGBA1C 5.6   Fasting Lipid Panel: No results for input(s): "CHOL", "HDL", "LDLCALC", "TRIG", "CHOLHDL", "LDLDIRECT" in the last 72 hours. Thyroid Function Tests: Recent Labs    03/30/22 1330  TSH 1.548   Anemia Panel: No results for input(s): "VITAMINB12", "FOLATE", "FERRITIN", "TIBC", "IRON", "RETICCTPCT" in the last 72 hours.   Radiology: CT HEAD WO CONTRAST (5MM)  Result Date: 03/30/2022 CLINICAL DATA:  Altered mental status EXAM: CT HEAD WITHOUT CONTRAST TECHNIQUE: Contiguous axial images were obtained from the base of the skull through the vertex without intravenous contrast. RADIATION DOSE REDUCTION: This exam was performed according to the departmental dose-optimization program which includes automated exposure control, adjustment of the mA and/or kV according to  patient size and/or use of iterative reconstruction technique. COMPARISON:  None Available. FINDINGS: Brain: There is no mass, hemorrhage or extra-axial collection. There is generalized atrophy without lobar predilection. Hypodensity of the white matter is most commonly associated with chronic microvascular disease. 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 Vascular: Atherosclerotic calcification of the vertebral and internal carotid arteries at the skull base. No abnormal hyperdensity of the major intracranial arteries or dural venous sinuses. Skull: The visualized skull base, calvarium and extracranial soft tissues are normal. Sinuses/Orbits: No fluid levels or advanced mucosal thickening of the visualized paranasal sinuses. No mastoid or middle ear effusion. The orbits are normal. IMPRESSION: 1. No acute intracranial abnormality. 2. Generalized atrophy and findings of chronic microvascular disease. 3. 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 Electronically Signed   By: Ulyses Jarred M.D.   On: 03/30/2022 23:11   DG Chest Portable 1 View  Result Date: 03/30/2022 CLINICAL DATA:  Vomiting.  Weakness. EXAM: PORTABLE CHEST 1 VIEW COMPARISON:  None FINDINGS: No pleural effusion. No pneumothorax. Enlarged cardiac contours. No focal airspace opacity. No radiographically apparent displaced rib fractures. Degenerative changes of the right glenohumeral and AC joint. IMPRESSION: Mild cardiomegaly.  No focal airspace opacity. Electronically Signed   By: Marin Roberts M.D.   On: 03/30/2022 11:48    ECHO 04/2021 INTERPRETATION  NORMAL LEFT VENTRICULAR SYSTOLIC FUNCTION  NORMAL RIGHT VENTRICULAR SYSTOLIC FUNCTION  MILD VALVULAR REGURGITATION (See above)  NO VALVULAR STENOSIS   TELEMETRY reviewed by me (LT) 03/31/2022 : sinus brady rates 48 bpm-50s, periods of NSR rates 60s, occasional premature atrial contractions  EKG reviewed by me: sinus bradycardia rate 48 bpm  Data reviewed by me (LT) 03/31/2022: admission H&P,  cross cover notes, cbc, bmp, vitals, tele, cxr, ua   Principal Problem:   UTI (urinary tract infection) Active Problems:   Acute encephalopathy  ASSESSMENT AND PLAN:  Eaden Hettinger. Emile Kyllo is a 33yoM with a PMH of HTN, HLD, DM2, hx CVA (2014), exertional dyspnea with recent lexiscan myoview 11/2021 (EF 58%, mild inferior scar w/o ischemia), memory impairment who presented to [facility] ED 03/30/22 with generalized weakness, vomiting, poor PO intake and confusion x 3 days. Noted to be bradycardic to 48bpm on presentation for which cardiology is consulted for further assistance. On tele remains in predominantly sinus bradycardia, asymptomatic, hemodynamically stable, with appropriate HR increase with alertness/activity to NSR in 60s.  # acute cystitis # failure to thrive # asymptomatic sinus bradycardia Wife reports the patient has experienced a ~24lb weight loss since 10/2021 and ongoing/worsening issues with memory loss (forgetting his birthday, not recognizing family members) + increasing dependence on his wife for ADLs. Presents with 4 days of minimal PO intake and one episode of diarrhea. Febrile to 100.4 on presentation, UA concerning for acute cystitis. In sinus brady to 48bpm on tele while sleeping, rate increases appropriately to 50s-60s when awake, not on AV nodal blockers, electrolytes WNL.  -Agree with current therapy for treatment of cystitis -Recommend continuous monitoring on telemetry while inpatient -Optimize electrolytes.  K >4, mag >2 -TSH WNL at 1.5, was 1.77 in January 2024 -Avoid any AV nodal blockers, BB, CCB -No evidence of symptomatic bradycardia, high-grade AV block, or sick sinus syndrome on telemetry so far.  No indication for temporary transvenous pacemaker or permanent pacemaker at this time. -no further cardiac diagnostics necessary.   Cardiology will sign off. Please haiku with questions or re-engage if needed.     This patient's plan of care was discussed and created  with Dr. Saralyn Pilar and he is in agreement.  Signed: Tristan Schroeder , PA-C 03/31/2022, 8:37 AM [facility]

## 2022-03-31 NOTE — Progress Notes (Signed)
Pt. Has not made urine since the beginning of the shift.  Bladder scanned multiple times with greatest value found = 3103m.  Pt. States he feels like he needs to pee but has been unable to go despite multiple positions tried.  Obtained order for in and out cath when we discovered the patient had challenging anatomy.  SFeliberto Gottron MD and BErlene Quan MD notified of the situation.  Pictures uploaded to [facility]  With the max value of 323mfound, in and out catheter intervention was deemed not necessary at this time.

## 2022-03-31 NOTE — Progress Notes (Signed)
  Progress Note   Patient: Philip Carpenter. UYQ:[id number] DOB: Jan 27, 1946 DOA: 03/30/2022     1 DOS: the patient was seen and examined on 03/31/2022   Brief hospital course:  Assessment and Plan: UTI  - IV ceftriaxone 1 g daily  - IV 1/2 NS 75 cc/hr  - F/u urine cx  - Flomax 0.4 mg PO daily  - Proscar 5 mg PO daily    Bradycardia  - Cardiology has signed off as of 03/31/2022   Debility  - PT/OT    HTN - Ramipril 5 mg PO daily  - Hydralazine 25 mg PO q8hr    5. DM  - Novolog SS ACHS  - ASA 325 mg PO daily  - Lipitor 10 mg PO daily    6. Dementia  - Aricept 10 mg PO daily  - Complicating care    DVT prophylaxis: Lovenox 50 mg sq daily       Subjective: Pt seen and examined at the bedside. WBC down trending slowly. Mentation is better than yesterday in the ER. Nursing advised that he still requires some re-direction. PT eval this morning has advised the pt requires SNF.  Physical Exam: Vitals:   03/31/22 1000 03/31/22 1015 03/31/22 1100 03/31/22 1143  BP:    129/69  Pulse:    66  Resp: '14 13 17 18  '$ Temp:    98.7 F (37.1 C)  TempSrc:    Oral  SpO2:    95%  Weight:       Constitutional:      Appearance: Normal appearance.  HENT:     Head: Normocephalic and atraumatic.     Mouth/Throat:     Mouth: Mucous membranes are moist.  Cardiovascular:     Rate and Rhythm: RRR Pulmonary:     Effort: Pulmonary effort is normal.     Breath sounds: Normal breath sounds.  Abdominal:     General: Abdomen is flat.     Palpations: Abdomen is soft.  Musculoskeletal:        General: Normal range of motion.     Cervical back: Neck supple.  Skin:    General: Skin is warm and dry.  Neurological:     Mental Status: He is alert.     Comments: Awake and answers questions, oriented x 0   Psychiatric:        Mood and Affect: Mood normal.   Data Reviewed:   Disposition: Status is: Inpatient  Planned Discharge Destination: Skilled nursing facility    Time spent: 35  minutes  Author: Lucienne Minks , MD 03/31/2022 11:53 AM  For on call review [email or web address].

## 2022-03-31 NOTE — Plan of Care (Signed)

## 2022-04-01 DIAGNOSIS — G9340 Encephalopathy, unspecified: Secondary | ICD-10-CM | POA: Diagnosis not present

## 2022-04-01 LAB — GLUCOSE, CAPILLARY
Glucose-Capillary: 186 mg/dL — ABNORMAL HIGH (ref 70–99)
Glucose-Capillary: 208 mg/dL — ABNORMAL HIGH (ref 70–99)
Glucose-Capillary: 207 mg/dL — ABNORMAL HIGH (ref 70–99)

## 2022-04-01 LAB — COMPREHENSIVE METABOLIC PANEL
ALT: 24 U/L (ref 0–44)
AST: 23 U/L (ref 15–41)
Albumin: 3.3 g/dL — ABNORMAL LOW (ref 3.5–5.0)
Alkaline Phosphatase: 51 U/L (ref 38–126)
Anion gap: 9 (ref 5–15)
BUN: 21 mg/dL (ref 8–23)
CO2: 27 mmol/L (ref 22–32)
Calcium: 8.3 mg/dL — ABNORMAL LOW (ref 8.9–10.3)
Chloride: 101 mmol/L (ref 98–111)
Creatinine, Ser: 0.77 mg/dL (ref 0.61–1.24)
GFR, Estimated: >60 mL/min (ref >60)
Glucose, Bld: 193 mg/dL — ABNORMAL HIGH (ref 70–99)
Potassium: 3.6 mmol/L (ref 3.5–5.1)
Sodium: 137 mmol/L (ref 135–145)
Total Bilirubin: 1.4 mg/dL — ABNORMAL HIGH (ref 0.3–1.2)
Total Protein: 6.3 g/dL — ABNORMAL LOW (ref 6.5–8.1)

## 2022-04-01 LAB — CBC
HCT: 38.6 % — ABNORMAL LOW (ref 39.0–52.0)
Hemoglobin: 12.6 g/dL — ABNORMAL LOW (ref 13.0–17.0)
MCH: 32.1 pg (ref 26.0–34.0)
MCHC: 32.6 g/dL (ref 30.0–36.0)
MCV: 98.5 fL (ref 80.0–100.0)
Platelets: 270 10*3/uL (ref 150–400)
RBC: 3.92 MIL/uL — ABNORMAL LOW (ref 4.22–5.81)
RDW: 13.4 % (ref 11.5–15.5)
WBC: 9.5 10*3/uL (ref 4.0–10.5)
nRBC: 0 % (ref 0.0–0.2)

## 2022-04-01 LAB — URINE CULTURE: Culture: 70000 — AB

## 2022-04-01 LAB — C-REACTIVE PROTEIN: CRP: 0.8 mg/dL (ref <1.0)

## 2022-04-01 LAB — MAGNESIUM: Magnesium: 1.9 mg/dL (ref 1.7–2.4)

## 2022-04-01 MED ORDER — CEFDINIR 300 MG PO CAPS
300.0000 mg | ORAL_CAPSULE | Freq: Two times a day (BID) | ORAL | Status: DC
  Administered 2022-04-02: 300 mg via ORAL
  Filled 2022-04-01: qty 1

## 2022-04-01 NOTE — Progress Notes (Signed)
  Progress Note   Patient: Philip Carpenter. GOV:[id number] DOB: 1946/01/05 DOA: 03/30/2022     2 DOS: the patient was seen and examined on 04/01/2022   Brief hospital course:  Assessment and Plan:  UTI  - IV ceftriaxone 1 g daily  - Urine cx 03/30/2022 --> E.coli sensitive to ceftriaxone - Flomax 0.4 mg PO daily  - Proscar 5 mg PO daily    Bradycardia - Resolved   - Cardiology has signed off as of 03/31/2022   Debility  - PT/OT has advised SNF (from the note dated 03/31/2022)   HTN - Ramipril 5 mg PO daily  - Hydralazine 25 mg PO q8hr    5. DM  - Novolog SS ACHS  - ASA 325 mg PO daily  - Lipitor 10 mg PO daily    6. Dementia  - Aricept 10 mg PO daily  - Complicating care    DVT prophylaxis: Lovenox 50 mg sq daily      Subjective: Pt seen and examined at the bedside. Urine cx growing e.coli. He continues on IV ceftriaxone. WBC now wnl (9.5). Disposition per case manager.   Physical Exam: Vitals:   04/01/22 0405 04/01/22 0448 04/01/22 0746 04/01/22 1128  BP:  119/86 (!) 133/48 133/76  Pulse:  (!) 56 62 71  Resp: '17  17 17  '$ Temp:  98.3 F (36.8 C) 98.1 F (36.7 C) 97.9 F (36.6 C)  TempSrc:  Oral    SpO2:  96% 95% 98%  Weight:       Constitutional:      Appearance: Normal appearance.  HENT:     Head: Normocephalic and atraumatic.     Mouth/Throat:     Mouth: Mucous membranes are moist.  Cardiovascular:     Rate and Rhythm: RRR Pulmonary:     Effort: Pulmonary effort is normal.     Breath sounds: Normal breath sounds.  Abdominal:     General: Abdomen is flat.     Palpations: Abdomen is soft.  Musculoskeletal:        General: Normal range of motion.     Cervical back: Neck supple.  Skin:    General: Skin is warm and dry.  Neurological:     Mental Status: He is alert.     Comments: Awake and answers questions Psychiatric:        Mood and Affect: Mood normal.   Data Reviewed:   Disposition: Status is: Inpatient  Planned Discharge Destination:  Skilled nursing facility    Time spent: 35 minutes  Author: Lucienne Minks , MD 04/01/2022 11:29 AM  For on call review [email or web address].

## 2022-04-02 DIAGNOSIS — E669 Obesity, unspecified: Secondary | ICD-10-CM | POA: Diagnosis not present

## 2022-04-02 DIAGNOSIS — R001 Bradycardia, unspecified: Secondary | ICD-10-CM | POA: Diagnosis present

## 2022-04-02 DIAGNOSIS — I10 Essential (primary) hypertension: Secondary | ICD-10-CM

## 2022-04-02 DIAGNOSIS — F03918 Unspecified dementia, unspecified severity, with other behavioral disturbance: Secondary | ICD-10-CM | POA: Diagnosis not present

## 2022-04-02 DIAGNOSIS — B9620 Unspecified Escherichia coli [E. coli] as the cause of diseases classified elsewhere: Secondary | ICD-10-CM

## 2022-04-02 DIAGNOSIS — N390 Urinary tract infection, site not specified: Secondary | ICD-10-CM

## 2022-04-02 DIAGNOSIS — Z6831 Body mass index (BMI) 31.0-31.9, adult: Secondary | ICD-10-CM

## 2022-04-02 LAB — CBC
HCT: 38.6 % — ABNORMAL LOW (ref 39.0–52.0)
Hemoglobin: 12.7 g/dL — ABNORMAL LOW (ref 13.0–17.0)
MCH: 31.6 pg (ref 26.0–34.0)
MCHC: 32.9 g/dL (ref 30.0–36.0)
MCV: 96 fL (ref 80.0–100.0)
Platelets: 279 10*3/uL (ref 150–400)
RBC: 4.02 MIL/uL — ABNORMAL LOW (ref 4.22–5.81)
RDW: 13.4 % (ref 11.5–15.5)
WBC: 8.7 10*3/uL (ref 4.0–10.5)
nRBC: 0 % (ref 0.0–0.2)

## 2022-04-02 LAB — GLUCOSE, CAPILLARY
Glucose-Capillary: 216 mg/dL — ABNORMAL HIGH (ref 70–99)
Glucose-Capillary: 224 mg/dL — ABNORMAL HIGH (ref 70–99)
Glucose-Capillary: 149 mg/dL — ABNORMAL HIGH (ref 70–99)
Glucose-Capillary: 213 mg/dL — ABNORMAL HIGH (ref 70–99)
Glucose-Capillary: 175 mg/dL — ABNORMAL HIGH (ref 70–99)

## 2022-04-02 LAB — COMPREHENSIVE METABOLIC PANEL
ALT: 22 U/L (ref 0–44)
AST: 20 U/L (ref 15–41)
Albumin: 3 g/dL — ABNORMAL LOW (ref 3.5–5.0)
Alkaline Phosphatase: 50 U/L (ref 38–126)
Anion gap: 6 (ref 5–15)
BUN: 12 mg/dL (ref 8–23)
CO2: 27 mmol/L (ref 22–32)
Calcium: 8.5 mg/dL — ABNORMAL LOW (ref 8.9–10.3)
Chloride: 104 mmol/L (ref 98–111)
Creatinine, Ser: 0.73 mg/dL (ref 0.61–1.24)
GFR, Estimated: >60 mL/min (ref >60)
Glucose, Bld: 229 mg/dL — ABNORMAL HIGH (ref 70–99)
Potassium: 3.4 mmol/L — ABNORMAL LOW (ref 3.5–5.1)
Sodium: 137 mmol/L (ref 135–145)
Total Bilirubin: 1.3 mg/dL — ABNORMAL HIGH (ref 0.3–1.2)
Total Protein: 5.9 g/dL — ABNORMAL LOW (ref 6.5–8.1)

## 2022-04-02 LAB — MAGNESIUM: Magnesium: 1.8 mg/dL (ref 1.7–2.4)

## 2022-04-02 LAB — C-REACTIVE PROTEIN: CRP: 0.6 mg/dL (ref <1.0)

## 2022-04-02 MED ORDER — ASPIRIN 81 MG PO CHEW
81.0000 mg | CHEWABLE_TABLET | Freq: Every day | ORAL | Status: DC
  Administered 2022-04-03 – 2022-04-04 (×2): 81 mg via ORAL
  Filled 2022-04-02 (×2): qty 1

## 2022-04-02 MED ORDER — INSULIN GLARGINE-YFGN 100 UNIT/ML ~~LOC~~ SOLN
10.0000 [IU] | Freq: Every day | SUBCUTANEOUS | Status: DC
  Administered 2022-04-02 – 2022-04-04 (×2): 10 [IU] via SUBCUTANEOUS
  Filled 2022-04-02 (×3): qty 0.1

## 2022-04-02 NOTE — Assessment & Plan Note (Signed)
Secondary to urinary tract infection.  Resolved.

## 2022-04-02 NOTE — Plan of Care (Signed)
  Problem: Coping: Goal: Ability to adjust to condition or change in health will improve Outcome: Progressing   Problem: Fluid Volume: Goal: Ability to maintain a balanced intake and output will improve Outcome: Progressing   Problem: Health Behavior/Discharge Planning: Goal: Ability to identify and utilize available resources and services will improve Outcome: Progressing   Problem: Skin Integrity: Goal: Risk for impaired skin integrity will decrease Outcome: Progressing

## 2022-04-02 NOTE — Assessment & Plan Note (Signed)
Sepsis ruled out.  Urine cultures for 70,000 colonies of E. coli with resistance to penicillin and Cipro and Bactrim, but has responded well to Rocephin.  Stable and has completed 3 days of IV antibiotics by 2/4

## 2022-04-02 NOTE — Hospital Course (Signed)
77 year old male with past medical history of obesity, senile dementia, asthma and diabetes mellitus presented to the emergency room on 2/1 with decreased ambulation and increased lethargy x 1 week.  Reportedly, the patient had not been taking his medications other than insulin, nor eating or drinking very much.  The emergency room, patient found to be bradycardic, hypertensive with systolic blood pressure in the 170s with a heart rate in the high 40s as well as have a large urinary tract infection.  CT scan of head was unremarkable.  Cardiology was consulted and with improvements in hydration and correction of electrolytes, bradycardia resolved.  Patient seen by physical therapy who are recommending SNF.  Patient remained stable with mildly elevated blood pressure.  P.o. hydralazine added to the home regimen.  Will discontinue home glipizide as patient is taking insulin to decrease the chances of hypoglycemia.  He will continue with insulin and metformin.  Patient will continue with rest of his medications and need to have a close follow-up with his providers for further recommendations.

## 2022-04-02 NOTE — Assessment & Plan Note (Signed)
A1c notes excellent control at 5.4.  CBG's slightly elevated here.

## 2022-04-02 NOTE — Progress Notes (Signed)
[facility] Progress Note  Patient: Philip Carpenter.    SNK:[id number]  DOA: 03/30/2022    Date of Service: the patient was seen and examined on 04/02/2022  Brief hospital course: 77 year old male with past medical history of obesity, senile dementia, asthma and diabetes mellitus presented to the emergency room on 2/1 with decreased ambulation and increased lethargy x 1 week.  Reportedly, the patient had not been taking his medications other than insulin, nor eating or drinking very much.  The emergency room, patient found to be bradycardic, hypertensive with systolic blood pressure in the 170s with a heart rate in the high 40s as well as have a large urinary tract infection.  CT scan of head was unremarkable.  Cardiology was consulted and with improvements in hydration and correction of electrolytes, bradycardia resolved.  Patient seen by physical therapy who are recommending SNF.   Assessment and Plan: * UTI (urinary tract infection)-resolved as of 04/02/2022 Sepsis ruled out.  Urine cultures for 70,000 colonies of E. coli with resistance to penicillin and Cipro and Bactrim, but has responded well to Rocephin.  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  Resolved.  Continue Aricept, Zoloft  Acute encephalopathy-resolved as of 04/02/2022 Secondary to urinary tract infection.  Resolved.  Sinus bradycardia-resolved as of 04/02/2022 Secondary to electrolyte disturbance, resolved with correction.  Cleared by cardiology.  Uncontrolled type 2 diabetes mellitus with hyperglycemia, with long-term current use of insulin (HCC) A1c notes excellent control at 5.4.  CBG's slightly elevated here.  Obesity Meets criteria with BMI greater than 30       Body mass index is 31.64 kg/m.        Consultants: None  Procedures: None  Antimicrobials: IV Rocephin 2/1 - 2/3  Code  Status: Full code   Subjective: No complaints  Objective: Vital signs were reviewed and unremarkable. Vitals:   04/02/22 0819 04/02/22 1228  BP: (!) 156/68 (!) 153/68  Pulse: 64 65  Resp: 18 18  Temp: 98.1 F (36.7 C) 98.1 F (36.7 C)  SpO2: 96% 96%    Intake/Output Summary (Last 24 hours) at 04/02/2022 1235 Last data filed at 04/02/2022 0558 Gross per 24 hour  Intake 240 ml  Output 1600 ml  Net -1360 ml   Filed Weights   03/30/22 1040  Weight: 100 kg   Body mass index is 31.64 kg/m.  Exam:  General: Oriented x 2, no acute distress HEENT: Normocephalic, atraumatic mucous membranes are moist Cardiovascular: Regular rate and rhythm, S1-S2 Respiratory: Clear to auscultation bilaterally Abdomen: Soft, nontender, nondistended, positive bowel sounds Musculoskeletal: No clubbing or cyanosis or edema Skin: No skin breaks, tears or lesions Psychiatry: Appropriate, some mild underlying dementia, no evidence of acute psychoses Neurology: No focal deficits  Data Reviewed: Potassium 3.4  Disposition:  Status is: Inpatient Remains inpatient appropriate because:  -Needs skilled nursing    Anticipated discharge date: 54/5  Family Communication: Wife at bedside DVT Prophylaxis: Lovenox    Author: Annita Brod ,MD 04/02/2022 12:35 PM  To reach On-call, see care teams to locate the attending and reach out via [email or web address]. Between 7PM-7AM, please contact night-coverage If you still have difficulty reaching the attending provider, please page the [facility] (Director on Call) for [facility] on amion for assistance.

## 2022-04-02 NOTE — TOC Progression Note (Addendum)
Transition of Care (TOC) - Progression Note    Patient Details  Name: Philip Carpenter. MRN: [id number] Date of Birth: [birth year]  [facility] CM/SW Contact  Izola Price, RN Phone Number: 04/02/2022, 3:36 PM  Clinical Narrative:  2/4: [facility] has accepted, preference of [facility] is considering, the remainder are pending. Simmie Davies RN CM          Expected Discharge Plan and Services                                               Social Determinants of Health (SDOH) Interventions SDOH Screenings   Food Insecurity: No Food Insecurity (03/30/2022)  Housing: Low Risk  (03/30/2022)  Transportation Needs: No Transportation Needs (03/30/2022)  Utilities: Not At Risk (03/30/2022)  Tobacco Use: Low Risk  (03/30/2022)    Readmission Risk Interventions     No data to display

## 2022-04-02 NOTE — Assessment & Plan Note (Addendum)
Acute behavioral disturbance secondary to UTI.  Resolved.  Continue Aricept, Zoloft.  On night of 2/4, patient was moved to a different room.  He has also not been on his as needed Ativan and so was agitated last night.  I restarted Ativan including adding a scheduled nighttime dose.

## 2022-04-02 NOTE — Assessment & Plan Note (Signed)
Meets criteria with BMI greater than 30 ?

## 2022-04-02 NOTE — Progress Notes (Signed)
Philip Carpenter, wife, informed of transfer to room 139.

## 2022-04-02 NOTE — Assessment & Plan Note (Signed)
Secondary to electrolyte disturbance, resolved with correction.  Cleared by cardiology.

## 2022-04-03 DIAGNOSIS — F03918 Unspecified dementia, unspecified severity, with other behavioral disturbance: Secondary | ICD-10-CM | POA: Diagnosis not present

## 2022-04-03 DIAGNOSIS — G9340 Encephalopathy, unspecified: Secondary | ICD-10-CM | POA: Diagnosis not present

## 2022-04-03 DIAGNOSIS — N390 Urinary tract infection, site not specified: Secondary | ICD-10-CM | POA: Diagnosis not present

## 2022-04-03 DIAGNOSIS — E669 Obesity, unspecified: Secondary | ICD-10-CM | POA: Diagnosis not present

## 2022-04-03 LAB — GLUCOSE, CAPILLARY
Glucose-Capillary: 178 mg/dL — ABNORMAL HIGH (ref 70–99)
Glucose-Capillary: 206 mg/dL — ABNORMAL HIGH (ref 70–99)
Glucose-Capillary: 214 mg/dL — ABNORMAL HIGH (ref 70–99)
Glucose-Capillary: 219 mg/dL — ABNORMAL HIGH (ref 70–99)

## 2022-04-03 MED ORDER — LORAZEPAM 0.5 MG PO TABS
0.5000 mg | ORAL_TABLET | Freq: Three times a day (TID) | ORAL | Status: DC | PRN
  Administered 2022-04-03: 0.5 mg via ORAL
  Filled 2022-04-03: qty 1

## 2022-04-03 MED ORDER — LORAZEPAM 0.5 MG PO TABS
0.5000 mg | ORAL_TABLET | Freq: Every day | ORAL | Status: DC
  Administered 2022-04-04: 0.5 mg via ORAL
  Filled 2022-04-03: qty 1

## 2022-04-03 NOTE — TOC Progression Note (Addendum)
Transition of Care (TOC) - Progression Note    Patient Details  Name: Philip Carpenter. MRN: [id number] Date of Birth: [birth year]  Transition of Care [facility]) CM/SW [place], LCSW Phone Number: 04/03/2022, 11:23 AM  Clinical Narrative:  Patient has one bed offer from [facility]. Wife has accepted. Left message for admissions coordinator to notify and see how soon they would have a bed. Will need to start auth.   2:01 pm: CMA will start insurance authorization.  4:13 pm: Auth approved: [id number]. Valid 2/5-2/7. Left message for [facility] admissions coordinator to notify.  Expected Discharge Plan and Services                                               Social Determinants of Health (SDOH) Interventions SDOH Screenings   Food Insecurity: No Food Insecurity (03/30/2022)  Housing: Low Risk  (03/30/2022)  Transportation Needs: No Transportation Needs (03/30/2022)  Utilities: Not At Risk (03/30/2022)  Tobacco Use: Low Risk  (03/30/2022)    Readmission Risk Interventions     No data to display

## 2022-04-03 NOTE — Progress Notes (Signed)
Occupational Therapy Treatment Patient Details Name: Philip Carpenter. MRN: [id number] DOB: [birth year] Today's Date: 04/03/2022   History of present illness Philip Carpenter. is a 77 y.o. male who presents to the hospital due to urinary frequency and failure to thrive at home. Sister provides the history as pt has baseline dementia (per the sister). Sister reports that ever since last Friday the pt has not been able to ambulate very well and has been a lot more lethargic recently. Dr. Doy Hutching the PCP advised a visit to the ER. In addition, pt has not been taking his home medications (other than insulin). Sister reports that he has not been eating and drinking as he normally does. Pt endorses urinary frequency, but no foul smelling urine and no bloody urine.  Workup in the ER revealed leukocytosis and a UTI on UA. As a result, the pt will be admitted for further management.      He is also noted to have bradycardia in the 48-49 range (which appears to be new). Cardiology was consulted 03/30/2022   OT comments  [name] seen for OT treatment on this date. Pt presents to OT services with decreased independence in ADL management and functional mobility. Pt has supportive spouse at bedside who states pt is "sleepy" today and nsg student in room indicates pt had just required +3 assist from nsg staff to safely transfer from bed to chair this aim. OT attempted to engage pt in functional mobility with RW, however, pt is unable to come to full stand with MAX A +1 and requests to remain sitting stating, "I'm feeling tired". Pt falls asleep intermittently during session and requires consistent cueing to attend to seated UB grooming tasks. He ultimately requires MOD A to perform oral care while sitting. Pt has had a decline in his functional status from time of initial OT evaluation. DC recs updated to STR. Pt continues to benefit from skilled OT services to maximize return to PLOF and minimize risk of future falls,  injury, caregiver burden, and readmission. Will continue to follow POC.     Recommendations for follow up therapy are one component of a multi-disciplinary discharge planning process, led by the attending physician.  Recommendations may be updated based on patient status, additional functional criteria and insurance authorization.    Follow Up Recommendations  Skilled nursing-short term rehab (<3 hours/day)     Assistance Recommended at Discharge Frequent or constant Supervision/Assistance  Patient can return home with the following  A little help with walking and/or transfers;A lot of help with bathing/dressing/bathroom;Assistance with cooking/housework;Help with stairs or ramp for entrance;Assist for transportation   Equipment Recommendations   (defer)    Recommendations for Other Services      Precautions / Restrictions Precautions Precautions: Fall Restrictions Weight Bearing Restrictions: No       Mobility Bed Mobility Overal bed mobility: Needs Assistance             General bed mobility comments: deferred. Pt in recliner at start/end of session.    Transfers Overall transfer level: Needs assistance Equipment used: Rolling walker (2 wheels) Transfers: Sit to/from Stand Sit to Stand: Max assist           General transfer comment: Unable to come to full stand despite max assist. Pt states "I'm just tired" and does not engage with further mobility attempts. Per nsg student at bedside, pt requires +3 assist to transfer from bed to chair just prior to OT arrival.  Balance Overall balance assessment: Needs assistance Sitting-balance support: Feet supported, No upper extremity supported Sitting balance-Leahy Scale: Fair                                     ADL either performed or assessed with clinical judgement   ADL Overall ADL's : Needs assistance/impaired Eating/Feeding: Minimal assistance;Cueing for sequencing   Grooming: Oral  care;Moderate assistance;Cueing for sequencing Grooming Details (indicate cue type and reason): Increased cueing/assistance required to perform ADL management this date. Spouse at bedside states pt is more "sleepy" today than previous sessions.                               General ADL Comments: Per nsg at bedside pt required significant increased assistance for transfer to room recliner this morning and needed +2 assistance. Anticipate level of assist flucctuates with cognition. Per spouse pt appears weaker and more lethargic than previous therapy sessions.    Extremity/Trunk Assessment Upper Extremity Assessment Upper Extremity Assessment: Generalized weakness   Lower Extremity Assessment Lower Extremity Assessment: Generalized weakness        Vision Patient Visual Report: No change from baseline     Perception     Praxis      Cognition Arousal/Alertness: Lethargic Behavior During Therapy: Flat affect Overall Cognitive Status: History of cognitive impairments - at baseline                                 General Comments: Pt has difficulty maintaining alertness during session. He falls asleep quickly without stimulation and requires consistent cueing to attend to tasks such as grooming and functional mobility attempts. Spouse at bedside states pt did not sleep well the previous night and is more "sleepy" today.        Exercises Other Exercises Other Exercises: OT facilitated ADL management as described above with consistent cueing for sequencing and attention t/o session. Pt/spouse provided with reinforcement of prior education on safety, falls prevention, and compensatory ADL management strategies. Spouse educated on updated DC recs this date.    Shoulder Instructions       General Comments      Pertinent Vitals/ Pain       Pain Assessment Pain Assessment: No/denies pain  Home Living                                           Prior Functioning/Environment              Frequency  Min 2X/week        Progress Toward Goals  OT Goals(current goals can now be found in the care plan section)  Progress towards OT goals: Progressing toward goals  Acute Rehab OT Goals Patient Stated Goal: to feel better OT Goal Formulation: With patient/family Time For Goal Achievement: 04/14/22 Potential to Achieve Goals: Good  Plan Discharge plan needs to be updated;Frequency remains appropriate    Co-evaluation                 AM-PAC OT "6 Clicks" Daily Activity     Outcome Measure   Help from another person eating meals?: A Little Help from another person taking care of personal  grooming?: A Lot Help from another person toileting, which includes using toliet, bedpan, or urinal?: A Lot Help from another person bathing (including washing, rinsing, drying)?: A Lot Help from another person to put on and taking off regular upper body clothing?: A Lot Help from another person to put on and taking off regular lower body clothing?: A Lot 6 Click Score: 13    End of Session Equipment Utilized During Treatment: Gait belt;Rolling walker (2 wheels)  OT Visit Diagnosis: Other abnormalities of gait and mobility (R26.89);Muscle weakness (generalized) (M62.81);Other symptoms and signs involving cognitive function   Activity Tolerance Patient tolerated treatment well   Patient Left in chair;with call bell/phone within reach;with chair alarm set;with family/visitor present   Nurse Communication          Time: 1040-1054 OT Time Calculation (min): 14 min  Charges: OT General Charges $OT Visit: 1 Visit OT Treatments $Self Care/Home Management : 8-22 mins  Shara Blazing, M.S., OTR/L 04/03/22, 12:13 PM

## 2022-04-03 NOTE — Progress Notes (Signed)
[facility] Progress Note  Patient: Philip Carpenter.    AOZ:[id number]  DOA: 03/30/2022    Date of Service: the patient was seen and examined on 04/03/2022  Brief hospital course: 77 year old male with past medical history of obesity, senile dementia, asthma and diabetes mellitus presented to the emergency room on 2/1 with decreased ambulation and increased lethargy x 1 week.  Reportedly, the patient had not been taking his medications other than insulin, nor eating or drinking very much.  The emergency room, patient found to be bradycardic, hypertensive with systolic blood pressure in the 170s with a heart rate in the high 40s as well as have a large urinary tract infection.  CT scan of head was unremarkable.  Cardiology was consulted and with improvements in hydration and correction of electrolytes, bradycardia resolved.  Patient seen by physical therapy who are recommending SNF.   Assessment and Plan: * UTI (urinary tract infection)-resolved as of 04/02/2022 Sepsis ruled out.  Urine cultures for 70,000 colonies of E. coli with resistance to penicillin and Cipro and Bactrim, but has responded well to Rocephin.  Stable and has completed 3 days of IV antibiotics by 2/4  Senile dementia with behavioral disturbance ([place]) Acute behavioral disturbance secondary to UTI.  Resolved.  Continue Aricept, Zoloft.  On night of 2/4, patient was moved to a different room.  He has also not been on his as needed Ativan and so was agitated last night.  I restarted Ativan including adding a scheduled nighttime dose.  Acute encephalopathy-resolved as of 04/02/2022 Secondary to urinary tract infection.  Resolved.  Sinus bradycardia-resolved as of 04/02/2022 Secondary to electrolyte disturbance, resolved with correction.  Cleared by cardiology.  Uncontrolled type 2 diabetes mellitus with hyperglycemia, with long-term current use of insulin (HCC) A1c notes excellent control at 5.4.  CBG's slightly elevated  here.  Obesity Meets criteria with BMI greater than 30       Body mass index is 31.64 kg/m.        Consultants: None  Procedures: None  Antimicrobials: IV Rocephin 2/1 - 2/3  Code Status: Full code   Subjective: Patient calm, no complaints  Objective: Vital signs were reviewed and unremarkable. Vitals:   04/03/22 0715 04/03/22 0846  BP: 135/65 (!) 118/47  Pulse: 66 61  Resp: 18 17  Temp: 98.3 F (36.8 C) 98.7 F (37.1 C)  SpO2: 96% 96%    Intake/Output Summary (Last 24 hours) at 04/03/2022 1103 Last data filed at 04/03/2022 0522 Gross per 24 hour  Intake 50 ml  Output 400 ml  Net -350 ml    Filed Weights   03/30/22 1040  Weight: 100 kg   Body mass index is 31.64 kg/m.  Exam:  General: Oriented x 2, no acute distress HEENT: Normocephalic, atraumatic mucous membranes are moist Cardiovascular: Regular rate and rhythm, S1-S2 Respiratory: Clear to auscultation bilaterally Abdomen: Soft, nontender, nondistended, positive bowel sounds Musculoskeletal: No clubbing or cyanosis or edema Skin: No skin breaks, tears or lesions Psychiatry: Appropriate, some mild underlying dementia, no evidence of acute psychoses Neurology: No focal deficits  Data Reviewed: Potassium 3.4  Disposition:  Status is: Inpatient Remains inpatient appropriate because:  -For skilled nursing if no issues overnight    Anticipated discharge date: 2/6  Family Communication: Wife at bedside DVT Prophylaxis: Lovenox    Author: Annita Brod ,MD 04/03/2022 11:03 AM  To reach On-call, see care teams to locate the attending and reach out via [email or web address]. Between 7PM-7AM, please contact night-coverage If  you still have difficulty reaching the attending provider, please page the [facility] (Director on Call) for [facility] on amion for assistance.

## 2022-04-03 NOTE — Care Management Important Message (Signed)
Important Message  Patient Details  Name: Philip Carpenter. MRN: [id number] Date of Birth: 1945-06-01   Medicare Important Message Given:  Yes     Dannette Barbara 04/03/2022, 1:49 PM

## 2022-04-03 NOTE — Plan of Care (Signed)
  Problem: Safety: Goal: Ability to remain free from injury will improve Outcome: Not Progressing   Problem: Nutrition: Goal: Adequate nutrition will be maintained Outcome: Not Progressing

## 2022-04-04 DIAGNOSIS — E119 Type 2 diabetes mellitus without complications: Secondary | ICD-10-CM | POA: Diagnosis not present

## 2022-04-04 DIAGNOSIS — N3090 Cystitis, unspecified without hematuria: Secondary | ICD-10-CM | POA: Diagnosis not present

## 2022-04-04 DIAGNOSIS — G9340 Encephalopathy, unspecified: Secondary | ICD-10-CM | POA: Diagnosis not present

## 2022-04-04 DIAGNOSIS — R001 Bradycardia, unspecified: Secondary | ICD-10-CM

## 2022-04-04 DIAGNOSIS — E1165 Type 2 diabetes mellitus with hyperglycemia: Secondary | ICD-10-CM

## 2022-04-04 DIAGNOSIS — Z794 Long term (current) use of insulin: Secondary | ICD-10-CM

## 2022-04-04 DIAGNOSIS — N390 Urinary tract infection, site not specified: Secondary | ICD-10-CM | POA: Diagnosis not present

## 2022-04-04 LAB — GLUCOSE, CAPILLARY
Glucose-Capillary: 245 mg/dL — ABNORMAL HIGH (ref 70–99)
Glucose-Capillary: 234 mg/dL — ABNORMAL HIGH (ref 70–99)
Glucose-Capillary: 201 mg/dL — ABNORMAL HIGH (ref 70–99)

## 2022-04-04 MED ORDER — ASPIRIN 81 MG PO CHEW: 81.0000 mg | CHEWABLE_TABLET | Freq: Every day | ORAL | Status: DC

## 2022-04-04 MED ORDER — LORAZEPAM 0.5 MG PO TABS: 0.5000 mg | ORAL_TABLET | Freq: Three times a day (TID) | ORAL | 0 refills | Status: DC | PRN

## 2022-04-04 MED ORDER — HYDRALAZINE HCL 25 MG PO TABS: 25.0000 mg | ORAL_TABLET | Freq: Three times a day (TID) | ORAL | Status: DC

## 2022-04-04 NOTE — Plan of Care (Signed)

## 2022-04-04 NOTE — Discharge Summary (Signed)
Physician Discharge Summary   Patient: Philip Carpenter. MRN: [id number] DOB: April 28, 1945  Admit date:     03/30/2022  Discharge date: 04/04/22  Discharge Physician: Lorella Nimrod   PCP: Idelle Crouch, MD   Recommendations at discharge:  Please obtain CBC and BMP in 1 week Follow-up with primary care provider within a week  Discharge Diagnoses: Principal Problem:   Urinary tract infection without hematuria Active Problems:   Senile dementia with behavioral disturbance (HCC)   HTN (hypertension)   Uncontrolled type 2 diabetes mellitus with hyperglycemia, with long-term current use of insulin (HCC)   Obesity   Cystitis  Resolved Problems:   Acute encephalopathy   Sinus bradycardia  Hospital Course: 77 year old male with past medical history of obesity, senile dementia, asthma and diabetes mellitus presented to the emergency room on 2/1 with decreased ambulation and increased lethargy x 1 week.  Reportedly, the patient had not been taking his medications other than insulin, nor eating or drinking very much.  The emergency room, patient found to be bradycardic, hypertensive with systolic blood pressure in the 170s with a heart rate in the high 40s as well as have a large urinary tract infection.  CT scan of head was unremarkable.  Cardiology was consulted and with improvements in hydration and correction of electrolytes, bradycardia resolved.  Patient seen by physical therapy who are recommending SNF.  Patient remained stable with mildly elevated blood pressure.  P.o. hydralazine added to the home regimen.  Will discontinue home glipizide as patient is taking insulin to decrease the chances of hypoglycemia.  He will continue with insulin and metformin.  Patient will continue with rest of his medications and need to have a close follow-up with his providers for further recommendations.  Assessment and Plan: * Urinary tract infection without hematuria Sepsis ruled out.  Urine cultures  for 70,000 colonies of E. coli with resistance to penicillin and Cipro and Bactrim, but has responded well to Rocephin.  Stable and has completed 3 days of IV antibiotics by 2/4  Senile dementia with behavioral disturbance ([place]) Acute behavioral disturbance secondary to UTI.  Resolved.  Continue Aricept, Zoloft.  On night of 2/4, patient was moved to a different room.  He has also not been on his as needed Ativan and so was agitated last night.  I restarted Ativan including adding a scheduled nighttime dose.  Acute encephalopathy-resolved as of 04/02/2022 Secondary to urinary tract infection.  Resolved.  Sinus bradycardia-resolved as of 04/02/2022 Secondary to electrolyte disturbance, resolved with correction.  Cleared by cardiology.  Uncontrolled type 2 diabetes mellitus with hyperglycemia, with long-term current use of insulin (HCC) A1c notes excellent control at 5.4.  CBG's slightly elevated here.  Obesity Meets criteria with BMI greater than 30    Consultants: Cardiology Procedures performed: None Disposition: Skilled nursing facility Diet recommendation:  Discharge Diet Orders (From admission, onward)     Start     Ordered   04/04/22 0000  Diet - low sodium heart healthy        04/04/22 1043           Cardiac and Carb modified diet DISCHARGE MEDICATION: Allergies as of 04/04/2022       Reactions   Metformin Diarrhea   Patient able to tolerate 500 mg XR        Medication List     STOP taking these medications    aspirin 325 MG tablet Replaced by: aspirin 81 MG chewable tablet   glipiZIDE 10 MG  tablet Commonly known as: GLUCOTROL       TAKE these medications    aspirin 81 MG chewable tablet Chew 1 tablet (81 mg total) by mouth daily. Replaces: aspirin 325 MG tablet   atorvastatin 10 MG tablet Commonly known as: LIPITOR Take 10 mg by mouth daily.   Basaglar KwikPen 100 UNIT/ML Inject 40 Units into the skin at bedtime.   divalproex 125 MG DR  tablet Commonly known as: DEPAKOTE Take 125 mg by mouth as directed.   donepezil 10 MG tablet Commonly known as: ARICEPT Take 10 mg by mouth daily.   finasteride 5 MG tablet Commonly known as: PROSCAR TAKE 1 TABLET (5 MG TOTAL) BY MOUTH DAILY.   hydrALAZINE 25 MG tablet Commonly known as: APRESOLINE Take 1 tablet (25 mg total) by mouth every 8 (eight) hours.   insulin lispro 100 UNIT/ML injection Commonly known as: HUMALOG Inject 0-100 Units into the skin 3 (three) times daily before meals.   LORazepam 0.5 MG tablet Commonly known as: ATIVAN Take 0.5 mg by mouth every 8 (eight) hours as needed.   metFORMIN 500 MG 24 hr tablet Commonly known as: GLUCOPHAGE-XR Take 500 mg by mouth daily with breakfast.   modafinil 100 MG tablet Commonly known as: PROVIGIL Take 100 mg by mouth daily.   Multi-Vitamins Tabs Take by mouth.   OneTouch Verio test strip Generic drug: glucose blood 3 (three) times daily.   ramipril 5 MG capsule Commonly known as: ALTACE Take 5 mg by mouth daily.   sertraline 25 MG tablet Commonly known as: ZOLOFT Take 25 mg by mouth daily.   tamsulosin 0.4 MG Caps capsule Commonly known as: FLOMAX Take 0.4 mg by mouth daily.   trospium 20 MG tablet Commonly known as: SANCTURA Take 1 tablet (20 mg total) by mouth 2 (two) times daily.        Contact information for follow-up providers     Idelle Crouch, MD. Schedule an appointment as soon as possible for a visit in 1 week(s).   Specialty: Internal Medicine Contact information: [place] [address] [phone]              Contact information for after-discharge care     Destination     [facility], [place] [facility] Preferred SNF .   Service: Skilled Nursing Contact information: [address] [place] [phone]                    Discharge Exam: Danley Danker Weights   03/30/22 1040  Weight:  100 kg   General.     In no acute distress. Pulmonary.  Lungs clear bilaterally, normal respiratory effort. CV.  Regular rate and rhythm, no JVD, rub or murmur. Abdomen.  Soft, nontender, nondistended, BS positive. CNS.  Alert and oriented to self only.  No focal neurologic deficit. Extremities.  No edema, no cyanosis, pulses intact and symmetrical. Psychiatry.  Judgment and insight appears impaired.   Condition at discharge: stable  The results of significant diagnostics from this hospitalization (including imaging, microbiology, ancillary and laboratory) are listed below for reference.   Imaging Studies: CT HEAD WO CONTRAST (5MM)  Result Date: 03/30/2022 CLINICAL DATA:  Altered mental status EXAM: CT HEAD WITHOUT CONTRAST TECHNIQUE: Contiguous axial images were obtained from the base of the skull through the vertex without intravenous contrast. RADIATION DOSE REDUCTION: This exam was performed according to the departmental dose-optimization program which includes automated exposure control,  adjustment of the mA and/or kV according to patient size and/or use of iterative reconstruction technique. COMPARISON:  None Available. FINDINGS: Brain: There is no mass, hemorrhage or extra-axial collection. There is generalized atrophy without lobar predilection. Hypodensity of the white matter is most commonly associated with chronic microvascular disease. 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 Vascular: Atherosclerotic calcification of the vertebral and internal carotid arteries at the skull base. No abnormal hyperdensity of the major intracranial arteries or dural venous sinuses. Skull: The visualized skull base, calvarium and extracranial soft tissues are normal. Sinuses/Orbits: No fluid levels or advanced mucosal thickening of the visualized paranasal sinuses. No mastoid or middle ear effusion. The orbits are normal. IMPRESSION: 1. No acute intracranial abnormality. 2. Generalized atrophy and findings of  chronic microvascular disease. 3. 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 Electronically Signed   By: Ulyses Jarred M.D.   On: 03/30/2022 23:11   DG Chest Portable 1 View  Result Date: 03/30/2022 CLINICAL DATA:  Vomiting.  Weakness. EXAM: PORTABLE CHEST 1 VIEW COMPARISON:  None FINDINGS: No pleural effusion. No pneumothorax. Enlarged cardiac contours. No focal airspace opacity. No radiographically apparent displaced rib fractures. Degenerative changes of the right glenohumeral and AC joint. IMPRESSION: Mild cardiomegaly.  No focal airspace opacity. Electronically Signed   By: Marin Roberts M.D.   On: 03/30/2022 11:48    Microbiology: Results for orders placed or performed during the hospital encounter of 03/30/22  Resp panel by RT-PCR (RSV, Flu A&B, Covid) Urine, Clean Catch     Status: None   Collection Time: 03/30/22 11:10 AM   Specimen: Urine, Clean Catch; Nasal Swab  Result Value Ref Range Status   SARS Coronavirus 2 by RT PCR NEGATIVE NEGATIVE Final    Comment: (NOTE) SARS-CoV-2 target nucleic acids are NOT DETECTED.  The SARS-CoV-2 RNA is generally detectable in upper respiratory specimens during the acute phase of infection. The lowest concentration of SARS-CoV-2 viral copies this assay can detect is 138 copies/mL. A negative result does not preclude SARS-Cov-2 infection and should not be used as the sole basis for treatment or other patient management decisions. A negative result may occur with  improper specimen collection/handling, submission of specimen other than nasopharyngeal swab, presence of viral mutation(s) within the areas targeted by this assay, and inadequate number of viral copies(<138 copies/mL). A negative result must be combined with clinical observations, patient history, and epidemiological information. The expected result is Negative.  Fact Sheet for Patients:  EntrepreneurPulse.com.au  Fact Sheet for Healthcare Providers:   IncredibleEmployment.be  This test is no t yet approved or cleared by the [place] FDA and  has been authorized for detection and/or diagnosis of SARS-CoV-2 by FDA under an Emergency Use Authorization (EUA). This EUA will remain  in effect (meaning this test can be used) for the duration of the COVID-19 declaration under Section 564(b)(1) of the Act, 21 U.S.C.section 360bbb-3(b)(1), unless the authorization is terminated  or revoked sooner.       Influenza A by PCR NEGATIVE NEGATIVE Final   Influenza B by PCR NEGATIVE NEGATIVE Final    Comment: (NOTE) The Xpert Xpress SARS-CoV-2/FLU/RSV plus assay is intended as an aid in the diagnosis of influenza from Nasopharyngeal swab specimens and should not be used as a sole basis for treatment. Nasal washings and aspirates are unacceptable for Xpert Xpress SARS-CoV-2/FLU/RSV testing.  Fact Sheet for Patients: EntrepreneurPulse.com.au  Fact Sheet for Healthcare Providers: IncredibleEmployment.be  This test is not yet approved or cleared by the [place] FDA  and has been authorized for detection and/or diagnosis of SARS-CoV-2 by FDA under an Emergency Use Authorization (EUA). This EUA will remain in effect (meaning this test can be used) for the duration of the COVID-19 declaration under Section 564(b)(1) of the Act, 21 U.S.C. section 360bbb-3(b)(1), unless the authorization is terminated or revoked.     Resp Syncytial Virus by PCR NEGATIVE NEGATIVE Final    Comment: (NOTE) Fact Sheet for Patients: EntrepreneurPulse.com.au  Fact Sheet for Healthcare Providers: IncredibleEmployment.be  This test is not yet approved or cleared by the [place] FDA and has been authorized for detection and/or diagnosis of SARS-CoV-2 by FDA under an Emergency Use Authorization (EUA). This EUA will remain in effect (meaning this test can be used) for  the duration of the COVID-19 declaration under Section 564(b)(1) of the Act, 21 U.S.C. section 360bbb-3(b)(1), unless the authorization is terminated or revoked.  Performed at [facility], [address] [place], [place] [address]   Urine Culture (for pregnant, neutropenic or urologic patients or patients with an indwelling urinary catheter)     Status: Abnormal   Collection Time: 03/30/22 11:10 AM   Specimen: Urine, Clean Catch  Result Value Ref Range Status   Specimen Description   Final    URINE, CLEAN CATCH Performed at [facility], [address] [place], [place] [address]    Special Requests   Final    NONE Performed at [facility], [address] [place], [place] [address]    Culture 70,000 COLONIES/mL ESCHERICHIA COLI (A)  Final   Report Status 04/01/2022 FINAL  Final   Organism ID, Bacteria ESCHERICHIA COLI (A)  Final      Susceptibility   Escherichia coli - MIC*    AMPICILLIN >=32 RESISTANT Resistant     CEFAZOLIN <=4 SENSITIVE Sensitive     CEFEPIME <=0.12 SENSITIVE Sensitive     CEFTRIAXONE <=0.25 SENSITIVE Sensitive     CIPROFLOXACIN >=4 RESISTANT Resistant     GENTAMICIN <=1 SENSITIVE Sensitive     IMIPENEM <=0.25 SENSITIVE Sensitive     NITROFURANTOIN <=16 SENSITIVE Sensitive     TRIMETH/SULFA >=320 RESISTANT Resistant     AMPICILLIN/SULBACTAM >=32 RESISTANT Resistant     PIP/TAZO <=4 SENSITIVE Sensitive     * 70,000 COLONIES/mL ESCHERICHIA COLI    Labs: CBC: Recent Labs  Lab 03/30/22 1110 03/31/22 0606 04/01/22 0427 04/02/22 0549  WBC 11.1* 10.7* 9.5 8.7  NEUTROABS 9.0*  --   --   --   HGB 14.4 13.2 12.6* 12.7*  HCT 44.1 40.9 38.6* 38.6*  MCV 98.4 98.8 98.5 96.0  PLT 307 276 270 989   Basic Metabolic Panel: Recent Labs  Lab 03/30/22 1110 03/30/22 1330 03/31/22 0606 04/01/22 0427 04/02/22 0549  NA 142  --  142 137 137  K 3.8  --  3.4* 3.6 3.4*  CL 104  --  103 101 104  CO2 23  --  '30 27 27  '$ GLUCOSE  134*  --  159* 193* 229*  BUN 25*  --  24* 21 12  CREATININE 0.80  --  0.82 0.77 0.73  CALCIUM 9.0  --  8.6* 8.3* 8.5*  MG  --  2.0 2.0 1.9 1.8  PHOS  --   --  3.8  --   --    Liver Function Tests: Recent Labs  Lab 03/30/22 1110 03/31/22 0606 04/01/22 0427 04/02/22 0549  AST 36 '21 23 20  '$ ALT '31 27 24 22  '$ ALKPHOS 53  50 51 50  BILITOT 2.0* 1.2 1.4* 1.3*  PROT 7.1 6.7 6.3* 5.9*  ALBUMIN 3.7 3.4* 3.3* 3.0*   CBG: Recent Labs  Lab 04/03/22 0806 04/03/22 1151 04/03/22 1650 04/03/22 2349 04/04/22 0835  GLUCAP 178* 206* 219* 214* 245*    Discharge time spent: greater than 30 minutes.  This record has been created using Systems analyst. Errors have been sought and corrected,but may not always be located. Such creation errors do not reflect on the standard of care.   Signed: Lorella Nimrod, MD [facility] 04/04/2022

## 2022-04-04 NOTE — TOC Transition Note (Signed)
Transition of Care [facility]) - CM/SW Discharge Note   Patient Details  Name: Philip Carpenter. MRN: [id number] Date of Birth: [birth year]  [facility]) CM/SW Contact:  Candie Chroman, LCSW Phone Number: 04/04/2022, 12:04 PM   Clinical Narrative:  Patient has orders to discharge to [facility] today. RN will call report to [phone] (Room 704). EMS transport has been arranged for 1:30. No further concerns. CSW signing off.   Final next level of care: [place] Barriers to Discharge: Barriers Resolved   Patient Goals and CMS Choice   Choice offered to / list presented to : Spouse  Discharge Placement PASRR number recieved: 03/31/22 PASRR number recieved: 03/31/22            Patient chooses bed at: [facility] [place] Patient to be transferred to facility by: EMS Name of family member notified: Ranulfo Kall Patient and family notified of of transfer: 04/04/22  Discharge Plan and Services Additional resources added to the After Visit Summary for                                       Social Determinants of Health (SDOH) Interventions SDOH Screenings   Food Insecurity: No Food Insecurity (03/30/2022)  Housing: Low Risk  (03/30/2022)  Transportation Needs: No Transportation Needs (03/30/2022)  Utilities: Not At Risk (03/30/2022)  Tobacco Use: Low Risk  (03/30/2022)     Readmission Risk Interventions     No data to display

## 2022-05-03 ENCOUNTER — Other Ambulatory Visit: Payer: Self-pay | Admitting: Internal Medicine

## 2022-05-03 DIAGNOSIS — R4182 Altered mental status, unspecified: Secondary | ICD-10-CM

## 2022-05-12 ENCOUNTER — Ambulatory Visit
Admission: RE | Admit: 2022-05-12 | Discharge: 2022-05-12 | Disposition: A | Discharge status: Home / Self Care | Payer: Medicare HMO | Source: Ambulatory Visit | Attending: Internal Medicine | Admitting: Internal Medicine

## 2022-05-12 DIAGNOSIS — R4182 Altered mental status, unspecified: Secondary | ICD-10-CM | POA: Insufficient documentation

## 2022-06-10 ENCOUNTER — Other Ambulatory Visit: Payer: Self-pay

## 2022-06-10 ENCOUNTER — Inpatient Hospital Stay
Admission: EM | Admit: 2022-06-10 | Discharge: 2022-06-16 | DRG: 086 | Disposition: A | Discharge status: Hospice | Payer: Medicare HMO | Attending: Student | Admitting: Student

## 2022-06-10 ENCOUNTER — Emergency Department: Payer: Medicare HMO

## 2022-06-10 ENCOUNTER — Inpatient Hospital Stay: Payer: Medicare HMO

## 2022-06-10 ENCOUNTER — Encounter: Payer: Self-pay | Admitting: Emergency Medicine

## 2022-06-10 DIAGNOSIS — E876 Hypokalemia: Secondary | ICD-10-CM | POA: Diagnosis present

## 2022-06-10 DIAGNOSIS — Z79899 Other long term (current) drug therapy: Secondary | ICD-10-CM

## 2022-06-10 DIAGNOSIS — S06350A Traumatic hemorrhage of left cerebrum without loss of consciousness, initial encounter: Principal | ICD-10-CM | POA: Diagnosis present

## 2022-06-10 DIAGNOSIS — E785 Hyperlipidemia, unspecified: Secondary | ICD-10-CM | POA: Diagnosis present

## 2022-06-10 DIAGNOSIS — Z66 Do not resuscitate: Secondary | ICD-10-CM | POA: Diagnosis present

## 2022-06-10 DIAGNOSIS — Z8673 Personal history of transient ischemic attack (TIA), and cerebral infarction without residual deficits: Secondary | ICD-10-CM | POA: Diagnosis not present

## 2022-06-10 DIAGNOSIS — I629 Nontraumatic intracranial hemorrhage, unspecified: Secondary | ICD-10-CM | POA: Diagnosis not present

## 2022-06-10 DIAGNOSIS — Y92008 Other place in unspecified non-institutional (private) residence as the place of occurrence of the external cause: Secondary | ICD-10-CM | POA: Diagnosis not present

## 2022-06-10 DIAGNOSIS — Z7984 Long term (current) use of oral hypoglycemic drugs: Secondary | ICD-10-CM | POA: Diagnosis not present

## 2022-06-10 DIAGNOSIS — N400 Enlarged prostate without lower urinary tract symptoms: Secondary | ICD-10-CM | POA: Diagnosis present

## 2022-06-10 DIAGNOSIS — Z23 Encounter for immunization: Secondary | ICD-10-CM

## 2022-06-10 DIAGNOSIS — Z794 Long term (current) use of insulin: Secondary | ICD-10-CM | POA: Diagnosis not present

## 2022-06-10 DIAGNOSIS — J45909 Unspecified asthma, uncomplicated: Secondary | ICD-10-CM | POA: Diagnosis present

## 2022-06-10 DIAGNOSIS — Z85820 Personal history of malignant melanoma of skin: Secondary | ICD-10-CM | POA: Diagnosis not present

## 2022-06-10 DIAGNOSIS — F32A Depression, unspecified: Secondary | ICD-10-CM | POA: Diagnosis present

## 2022-06-10 DIAGNOSIS — E119 Type 2 diabetes mellitus without complications: Secondary | ICD-10-CM | POA: Diagnosis present

## 2022-06-10 DIAGNOSIS — F0393 Unspecified dementia, unspecified severity, with mood disturbance: Secondary | ICD-10-CM | POA: Diagnosis present

## 2022-06-10 DIAGNOSIS — S0633AA Contusion and laceration of cerebrum, unspecified, with loss of consciousness status unknown, initial encounter: Secondary | ICD-10-CM | POA: Diagnosis present

## 2022-06-10 DIAGNOSIS — Z515 Encounter for palliative care: Secondary | ICD-10-CM

## 2022-06-10 DIAGNOSIS — R4182 Altered mental status, unspecified: Secondary | ICD-10-CM | POA: Diagnosis present

## 2022-06-10 DIAGNOSIS — I10 Essential (primary) hypertension: Secondary | ICD-10-CM | POA: Diagnosis not present

## 2022-06-10 DIAGNOSIS — F0394 Unspecified dementia, unspecified severity, with anxiety: Secondary | ICD-10-CM | POA: Diagnosis present

## 2022-06-10 DIAGNOSIS — Z7982 Long term (current) use of aspirin: Secondary | ICD-10-CM | POA: Diagnosis not present

## 2022-06-10 DIAGNOSIS — S61411A Laceration without foreign body of right hand, initial encounter: Secondary | ICD-10-CM | POA: Diagnosis present

## 2022-06-10 DIAGNOSIS — S06310A Contusion and laceration of right cerebrum without loss of consciousness, initial encounter: Secondary | ICD-10-CM | POA: Diagnosis not present

## 2022-06-10 LAB — COMPREHENSIVE METABOLIC PANEL
ALT: 30 U/L (ref 0–44)
AST: 43 U/L — ABNORMAL HIGH (ref 15–41)
Albumin: 3.2 g/dL — ABNORMAL LOW (ref 3.5–5.0)
Alkaline Phosphatase: 47 U/L (ref 38–126)
Anion gap: 9 (ref 5–15)
BUN: 15 mg/dL (ref 8–23)
CO2: 24 mmol/L (ref 22–32)
Calcium: 8.3 mg/dL — ABNORMAL LOW (ref 8.9–10.3)
Chloride: 106 mmol/L (ref 98–111)
Creatinine, Ser: 0.64 mg/dL (ref 0.61–1.24)
GFR, Estimated: >60 mL/min (ref >60)
Glucose, Bld: 69 mg/dL — ABNORMAL LOW (ref 70–99)
Potassium: 4 mmol/L (ref 3.5–5.1)
Sodium: 139 mmol/L (ref 135–145)
Total Bilirubin: 1.2 mg/dL (ref 0.3–1.2)
Total Protein: 6.2 g/dL — ABNORMAL LOW (ref 6.5–8.1)

## 2022-06-10 LAB — CBC
HCT: 40.2 % (ref 39.0–52.0)
Hemoglobin: 13.1 g/dL (ref 13.0–17.0)
MCH: 32.6 pg (ref 26.0–34.0)
MCHC: 32.6 g/dL (ref 30.0–36.0)
MCV: 100 fL (ref 80.0–100.0)
Platelets: 195 10*3/uL (ref 150–400)
RBC: 4.02 MIL/uL — ABNORMAL LOW (ref 4.22–5.81)
RDW: 14.7 % (ref 11.5–15.5)
WBC: 14 10*3/uL — ABNORMAL HIGH (ref 4.0–10.5)
nRBC: 0 % (ref 0.0–0.2)

## 2022-06-10 LAB — GLUCOSE, CAPILLARY
Glucose-Capillary: 85 mg/dL (ref 70–99)
Glucose-Capillary: 115 mg/dL — ABNORMAL HIGH (ref 70–99)

## 2022-06-10 LAB — PROTIME-INR
INR: 1.1 (ref 0.8–1.2)
Prothrombin Time: 14.1 seconds (ref 11.4–15.2)

## 2022-06-10 MED ORDER — NICARDIPINE HCL IN NACL 20-0.86 MG/200ML-% IV SOLN
0.0000 mg/h | INTRAVENOUS | Status: DC
  Administered 2022-06-10: 5 mg/h via INTRAVENOUS
  Administered 2022-06-11: 1.5 mg/h via INTRAVENOUS
  Administered 2022-06-11: 2.5 mg/h via INTRAVENOUS
  Administered 2022-06-12: 1.5 mg/h via INTRAVENOUS
  Filled 2022-06-10 (×3): qty 200

## 2022-06-10 MED ORDER — TETANUS-DIPHTH-ACELL PERTUSSIS 5-2.5-18.5 LF-MCG/0.5 IM SUSY
0.5000 mL | PREFILLED_SYRINGE | Freq: Once | INTRAMUSCULAR | Status: AC
  Administered 2022-06-10: 0.5 mL via INTRAMUSCULAR
  Filled 2022-06-10: qty 0.5

## 2022-06-10 MED ORDER — DOCUSATE SODIUM 100 MG PO CAPS: 100.0000 mg | ORAL_CAPSULE | Freq: Two times a day (BID) | ORAL | Status: DC | PRN

## 2022-06-10 MED ORDER — ORAL CARE MOUTH RINSE
15.0000 mL | OROMUCOSAL | Status: DC
  Administered 2022-06-11 – 2022-06-13 (×10): 15 mL via OROMUCOSAL

## 2022-06-10 MED ORDER — ACETAMINOPHEN 325 MG PO TABS
650.0000 mg | ORAL_TABLET | ORAL | Status: DC | PRN
  Administered 2022-06-11 – 2022-06-16 (×4): 650 mg via ORAL
  Filled 2022-06-10 (×4): qty 2

## 2022-06-10 MED ORDER — STROKE: EARLY STAGES OF RECOVERY BOOK: Freq: Once | Status: AC

## 2022-06-10 MED ORDER — ONDANSETRON HCL 4 MG/2ML IJ SOLN
4.0000 mg | Freq: Four times a day (QID) | INTRAMUSCULAR | Status: DC | PRN
  Administered 2022-06-10 – 2022-06-11 (×3): 4 mg via INTRAVENOUS
  Filled 2022-06-10 (×3): qty 2

## 2022-06-10 MED ORDER — POLYETHYLENE GLYCOL 3350 17 G PO PACK: 17.0000 g | PACK | Freq: Every day | ORAL | Status: DC | PRN

## 2022-06-10 MED ORDER — ORAL CARE MOUTH RINSE: 15.0000 mL | OROMUCOSAL | Status: DC | PRN

## 2022-06-10 MED ORDER — PANTOPRAZOLE SODIUM 40 MG IV SOLR
40.0000 mg | Freq: Every day | INTRAVENOUS | Status: DC
  Administered 2022-06-10: 40 mg via INTRAVENOUS
  Filled 2022-06-10: qty 10

## 2022-06-10 MED ORDER — LIDOCAINE HCL (PF) 1 % IJ SOLN
5.0000 mL | Freq: Once | INTRAMUSCULAR | Status: AC
  Administered 2022-06-10: 5 mL
  Filled 2022-06-10: qty 5

## 2022-06-10 NOTE — ED Notes (Signed)
Press photographer received call from [place], [place] in flex, stating that patient had fallen and there were concerns for aspiration. When this RN arrived in flex patient was being assisted back to bed with flex staff, pt had c collar in place. Annabelle Harman, NP for ICU was informed by this RN that pt had fallen and that there we concerns for possible aspiration. Orders placed for C Spine CT and chest x-ray, Head CT already ordered. Pt taken to CT by primary RN and will be transported to ICU after imaging.

## 2022-06-10 NOTE — Progress Notes (Addendum)
Progress Note  Notified by [facility] Radiology regarding worsening CT Head results at 1810 sent secure chat message to neurosurgeon Dr. Adriana Simas.  Neurosurgery recommended continue monitoring for now and maintain sbp less than 140 for now.  Will continue to monitor and assess pt   Zada Girt, [facility]  Pulmonary/Critical Care Pager [phone] (please enter 7 digits) PCCM Consult Pager [phone] (please enter 7 digits)

## 2022-06-10 NOTE — ED Triage Notes (Signed)
Pt to ED via [facility] from home for Fall this morning. Pt was getting out of his truck and fell, hitting his head on concrete. Pt has large hematoma on Right posterior head. Pt is at his baseline. Per EMS pt denies LOC. Pt is not on blood thinners.

## 2022-06-10 NOTE — ED Provider Notes (Addendum)
[facility] Provider Note    Event Date/Time   First MD Initiated Contact with Patient 06/10/22 1304     (approximate)  History   Chief Complaint: Fall  HPI  Philip Carpenter. is a 77 y.o. male with a past medical history of asthma, diabetes, hypertension, hyperlipidemia, prior CVA, presents to the emergency department after mechanical fall.  According to the wife patient had a fall backwards hitting the back of his head.  No LOC reported.  No anticoagulation besides baby aspirin.  Patient does have a moderate hematoma to right occipital scalp also has a laceration to the dorsal aspect of the right hand that is hemostatic but mildly gaping.  Unknown last tetanus shot we will update in the emergency department.  Physical Exam   Triage Vital Signs: ED Triage Vitals  Enc Vitals Group     BP 06/10/22 1223 (!) 144/76     Pulse Rate 06/10/22 1223 72     Resp 06/10/22 1223 18     Temp 06/10/22 1223 97.9 F (36.6 C)     Temp src --      SpO2 06/10/22 1222 96 %     Weight --      Height --      Head Circumference --      [facility] --      Pain Score 06/10/22 1225 3     Pain Loc --      Pain Edu? --      Excl. in GC? --     Most recent vital signs: Vitals:   06/10/22 1222 06/10/22 1223  BP:  (!) 144/76  Pulse:  72  Resp:  18  Temp:  97.9 F (36.6 C)  SpO2: 96% 94%    General: Awake, no distress.  Moderate occipital scalp hematoma. CV:  Good peripheral perfusion.  Regular rate and rhythm  Resp:  Normal effort.  Equal breath sounds bilaterally.  Abd:  No distention.  Soft, nontender.  No rebound or guarding. Other:  Patient has a 1.5 cm laceration slightly gaping to the dorsal aspect of the right hand, hemostatic.  Good range of motion in all extremities with no pain elicited.  No chest or abdominal tenderness.   ED Results / Procedures / Treatments   RADIOLOGY  Reviewed and interpreted the CT head images.  Patient has a small approximately  1.5 cm intraparenchymal bleed on my evaluation. Radiology confirms 1.6 x 1.0 x 1.2 cm intraparenchymal bleed without midline shift or mass effect.   MEDICATIONS ORDERED IN ED: Medications  Tdap (BOOSTRIX) injection 0.5 mL (has no administration in time range)  lidocaine (PF) (XYLOCAINE) 1 % injection 5 mL (has no administration in time range)     IMPRESSION / MDM / ASSESSMENT AND PLAN / ED COURSE  I reviewed the triage vital signs and the nursing notes.  Patient's presentation is most consistent with acute presentation with potential threat to life or bodily function.  Patient presents emergency department after mechanical fall.  Patient does have a small laceration that will require likely 1-2 sutures on the dorsal aspect of the right hand.  Patient has a moderate hematoma to the right occipital scalp, we will proceed with CT imaging the head and C-spine as a precaution.  Patient agreeable to plan of care.  We will update the patient's tetanus as they are unsure when the last tetanus shot was.  CBC shows mild leukocytosis of 14,000 otherwise reassuring, chemistry shows no  concerning findings.  CT unfortunately shows the patient has a small intraparenchymal bleed.  I spoke to Dr. Adriana Simas of neurosurgery who recommends admission to the ICU for ongoing monitoring.  I spoke to the wife and patient they are agreeable to this plan as well.  Patient continues to be awake alert mentating appropriately and answering questions without issue.  LACERATION REPAIR Performed by: Minna Antis Authorized by: Minna Antis Consent: Verbal consent obtained. Risks and benefits: risks, benefits and alternatives were discussed Consent given by: patient Patient identity confirmed: provided demographic data Prepped and Draped in normal sterile fashion Wound explored  Laceration Location: Dorsal aspect of right hand  Laceration Length: 1.5 cm  No Foreign Bodies seen or palpated  Anesthesia: local  infiltration  Local anesthetic: lidocaine 1% without epinephrine  Anesthetic total: 2 ml  Irrigation method: syringe Amount of cleaning: standard  Skin closure: 5-0 rapid Vicryl  Number of sutures: 2  Technique: Simple interrupted  Patient tolerance: Patient tolerated the procedure well with no immediate complications.  CRITICAL CARE Performed by: Minna Antis   Total critical care time: 30 minutes  Critical care time was exclusive of separately billable procedures and treating other patients.  Critical care was necessary to treat or prevent imminent or life-threatening deterioration.  Critical care was time spent personally by me on the following activities: development of treatment plan with patient and/or surrogate as well as nursing, discussions with consultants, evaluation of patient's response to treatment, examination of patient, obtaining history from patient or surrogate, ordering and performing treatments and interventions, ordering and review of laboratory studies, ordering and review of radiographic studies, pulse oximetry and re-evaluation of patient's condition.    FINAL CLINICAL IMPRESSION(S) / ED DIAGNOSES   Intracranial hemorrhage  fall Head injury Laceration    Note:  This document was prepared using Dragon voice recognition software and may include unintentional dictation errors.   Minna Antis, MD 06/10/22 1503    Minna Antis, MD 06/10/22 [phone]

## 2022-06-10 NOTE — ED Notes (Signed)
RN notified by CT tech that pt was found on the floor. Provider at bedside. Used backboard to get pt back in bed. C-collar in place. VSS 98.63f 129/51 71 HR 95% on RA 13R Pt transported to CT, xray then ICU room 2

## 2022-06-10 NOTE — ED Notes (Signed)
Laceration noted to right hand between ring and pinky knuckles. Bleeding controlled.

## 2022-06-10 NOTE — Progress Notes (Signed)
PHARMACY CONSULT NOTE  Pharmacy Consult for Electrolyte Monitoring and Replacement   Recent Labs: Potassium (mmol/L)  Date Value  06/10/2022 4.0  12/30/2012 4.0   Magnesium (mg/dL)  Date Value  47/42/5956 1.8   Calcium (mg/dL)  Date Value  38/75/6433 8.3 (L)   Calcium, Total (mg/dL)  Date Value  29/51/8841 8.9   Albumin (g/dL)  Date Value  66/07/3014 3.2 (L)  12/27/2012 3.4   Phosphorus (mg/dL)  Date Value  03/07/3233 3.8   Sodium (mmol/L)  Date Value  06/10/2022 139  12/30/2012 138    Assessment: 77 year old male presented to ED after mechanical fall. Being admitted to CCU with acute intraparenchymal hematoma. PMH asthma, diabetes, hypertension, hyperlipidemia, prior CVA. Pharmacy has been consulted for management of electrolytes.  Renal function stable and consistent with baseline.   Goal of Therapy:  Electrolytes within normal limits  Plan:  No replacement warranted today Follow up electrolytes tomorrow AM   Elliot Gurney, PharmD, BCPS Clinical Pharmacist  06/10/2022 3:15 PM

## 2022-06-10 NOTE — H&P (Signed)
NAME:  Philip Stocks., MRN:  [id number], DOB:  02-05-1946, LOS: 0 ADMISSION DATE:  06/10/2022, CONSULTATION DATE: 06/10/2022 REFERRING MD: Dr. Lenard Lance , CHIEF COMPLAINT: Fall   History of Present Illness:  This is a 77 yo male who presented to [facility] ER on 04/13 following a mechanical fall.  Per ER notes pts wife reported the pt fell backwards and hit the back of his head while getting out of a truck.  He does not take anticoagulation, but does take aspirin 81 mg daily.    ED Course Upon arrival to the ER pt noted to have a moderate size hematoma on the right occipital scalp.  He also had a laceration on the dorsal aspect of his right hand requiring 1-2 sutures.  CT Head revealed an acute intraparenchymal hematoma within the periventricular white matter of the left parietoccipital region measuring 16 x 10 x 12 mm with mild adjacent edema.  ER provider consulted Neurosurgeon Dr. Adriana Simas who recommended admission to ICU for ongoing monitoring, and repeating CT Head in 6hrs.  PCCM team contacted for ICU admission.  Significant labs: glucose 69/calcium 8.3/albumin 3.2/AST 43/wbc 14.0  CT Head/Cervical Spine: Acute intraparenchymal hematoma within the periventricular white matter of the left parietooccipital region measuring 16 x 10 x 12 mm with mild adjacent edema. No significant mass effect or midline shift. Large scalp hematoma overlying the right parietal and occipital regions. No underlying adrenal fracture. No acute fracture or traumatic listhesis of the cervical spine.  Pertinent  Medical History  Asthma Type II Diabetes Mellitus  HLD HTN  Melanoma of Scalp Obesity  CVA   Significant Hospital Events: Including procedures, antibiotic start and stop dates in addition to other pertinent events   04/13: Pt admitted to ICU with acute intraparenchymal hematoma and large scalp hematoma overlying the right parietal and occipital regions for closer monitoring   Interim History / Subjective:   Upon arrival at bedside pt having new onset nausea/vomiting current bp 148/56.  Neurological exam limited due to dementia.  Pts wife reports pt has difficulty with ambulation at home and uses a "walking stick" at baseline   Objective   Blood pressure (!) 144/76, pulse 72, temperature 97.9 F (36.6 C), resp. rate 18, SpO2 94 %.       No intake or output data in the 24 hours ending 06/10/22 1503 There were no vitals filed for this visit.  Examination: General: Acute on chronically-ill appearing elderly male, having intermittent emesis  HENT: Large hematoma dorsal aspect of head (see below), no JVD  Lungs: Rhonchi throughout, even, non labored  Cardiovascular: Sinus bradycardia, s1s2, no r/g, 2+ radial/1+ distal pulses, no edema   Abdomen: +BS x4, obese, non tender, non distended  Extremities: Generalized weakness, moves all extremities  Skin: Right hand laceration, large hematoma on the occipital region      Neuro: Interment lethargy oriented to self only, attempting to follow commands, neurological exam very limited due to dementia hx, PERRLA  GU: Deferred   Resolved Hospital Problem list     Assessment & Plan:  #Acute intraparenchymal hematoma secondary to mechanical fall  #HTN  Hx: CVA and Dementia  - Continuous telemetry monitoring  - NIH stroke scale q1hr for now  - Keep HOB elevated at 30 degrees  - Nicardipine gtt to maintain sbp 130-150 - Stat CT Head for acute neurological changes  - Neurosurgery consulted appreciate input: no indication for surgical intervention at this time  - Avoid chemical VTE prophylaxis  -  Coag results pending  - Trend CBC  - Fall precautions  - Will need PT/OT   #Asthma~stable  - Supplemental O2 for dyspnea and/or hypoxia  - Prn bronchodilator therapy   #Nausea/vomiting  - Prn zofran  - Continue PPI   #Type II diabetes mellitus  - CBG's q4hrs  - Follow hyper/hypoglycemic protocol  - Hemoglobin A1c pending   Best Practice (right  click and "Reselect all SmartList Selections" daily)   Diet/type: NPO DVT prophylaxis: SCD GI prophylaxis: PPI Lines: N/A Foley:  N/A Code Status:  DNR Last date of multidisciplinary goals of care discussion [N/A]  04/13: Discussed pts condition and current plan of care with pts wife at bedside.  Also, discussed code status with pts wife Philip Carpenter and following discussion she requested pts code status to be changed to DO NOT RESUSCITATE/ DO NOT INTUBATE.    Labs   CBC: Recent Labs  Lab 06/10/22 1441  WBC 14.0*  HGB 13.1  HCT 40.2  MCV 100.0  PLT 195    Basic Metabolic Panel: Recent Labs  Lab 06/10/22 1441  NA 139  K 4.0  CL 106  CO2 24  GLUCOSE 69*  BUN 15  CREATININE 0.64  CALCIUM 8.3*   GFR: CrCl cannot be calculated (Unknown ideal weight.). Recent Labs  Lab 06/10/22 1441  WBC 14.0*    Liver Function Tests: Recent Labs  Lab 06/10/22 1441  AST 43*  ALT 30  ALKPHOS 47  BILITOT 1.2  PROT 6.2*  ALBUMIN 3.2*   No results for input(s): "LIPASE", "AMYLASE" in the last 168 hours. No results for input(s): "AMMONIA" in the last 168 hours.  ABG No results found for: "PHART", "PCO2ART", "PO2ART", "HCO3", "TCO2", "ACIDBASEDEF", "O2SAT"   Coagulation Profile: No results for input(s): "INR", "PROTIME" in the last 168 hours.  Cardiac Enzymes: No results for input(s): "CKTOTAL", "CKMB", "CKMBINDEX", "TROPONINI" in the last 168 hours.  HbA1C: Hemoglobin A1C  Date/Time Value Ref Range Status  12/30/2012 05:41 AM 9.0 (H) 4.2 - 6.3 % Final    Comment:    The American Diabetes Association recommends that a primary goal of therapy should be <7% and that physicians should reevaluate the treatment regimen in patients with HbA1c values consistently >8%.    Hgb A1c MFr Bld  Date/Time Value Ref Range Status  03/30/2022 11:10 AM 5.6 4.8 - 5.6 % Final    Comment:    (NOTE) Pre diabetes:          5.7%-6.4%  Diabetes:              >6.4%  Glycemic control for    <7.0% adults with diabetes     CBG: No results for input(s): "GLUCAP" in the last 168 hours.  Review of Systems: Positives in BOLD   Gen: Denies fever, chills, weight change, fatigue, night sweats HEENT: Denies blurred vision, double vision, hearing loss, tinnitus, sinus congestion, rhinorrhea, sore throat, neck stiffness, dysphagia PULM: Denies shortness of breath, cough, sputum production, hemoptysis, wheezing CV: Denies chest pain, edema, orthopnea, paroxysmal nocturnal dyspnea, palpitations GI: abdominal pain, nausea, vomiting, diarrhea, hematochezia, melena, constipation, change in bowel habits GU: Denies dysuria, hematuria, polyuria, oliguria, urethral discharge Endocrine: Denies hot or cold intolerance, polyuria, polyphagia or appetite change Derm: Denies rash, dry skin, scaling or peeling skin change Heme: Denies easy bruising, bleeding, bleeding gums Neuro: Denies headache, numbness, weakness, slurred speech, loss of memory or consciousness  Past Medical History:  He,  has a past medical history of Asthma, Diabetes  mellitus without complication, Hyperlipidemia, Hypertension, Melanoma of scalp, Obesity, and Stroke.   Surgical History:   Past Surgical History:  Procedure Laterality Date   CHOLECYSTECTOMY     colonoscopy     COLONOSCOPY N/A 02/09/2021   Procedure: COLONOSCOPY;  Surgeon: Toledo, Boykin Nearing, MD;  Location: [facility] ENDOSCOPY;  Service: Gastroenterology;  Laterality: N/A;  IDDM (UNCONTROLLED)   COLONOSCOPY WITH PROPOFOL N/A 01/29/2018   Procedure: COLONOSCOPY WITH PROPOFOL;  Surgeon: Toledo, Boykin Nearing, MD;  Location: [facility] ENDOSCOPY;  Service: Gastroenterology;  Laterality: N/A;   right radius fracture repair       Social History:   reports that he has never smoked. He has never used smokeless tobacco. He reports that he does not drink alcohol and does not use drugs.   Family History:  His family history is negative for Prostate cancer, Bladder Cancer, and  Kidney cancer.   Allergies Allergies  Allergen Reactions   Metformin Diarrhea    Patient able to tolerate 500 mg XR     Home Medications  Prior to Admission medications   Medication Sig Start Date End Date Taking? Authorizing Provider  aspirin 81 MG chewable tablet Chew 1 tablet (81 mg total) by mouth daily. 04/04/22   Arnetha Courser, MD  atorvastatin (LIPITOR) 10 MG tablet Take 10 mg by mouth daily. 09/06/15   [provider]  divalproex (DEPAKOTE) 125 MG DR tablet Take 125 mg by mouth as directed. 03/16/22   [provider]  donepezil (ARICEPT) 10 MG tablet Take 10 mg by mouth daily. 11/11/21   [provider]  finasteride (PROSCAR) 5 MG tablet TAKE 1 TABLET (5 MG TOTAL) BY MOUTH DAILY. 01/20/19   Stoioff, Verna Czech, MD  hydrALAZINE (APRESOLINE) 25 MG tablet Take 1 tablet (25 mg total) by mouth every 8 (eight) hours. 04/04/22   Arnetha Courser, MD  Insulin Glargine (BASAGLAR KWIKPEN) 100 UNIT/ML Inject 40 Units into the skin at bedtime.    [provider]  insulin lispro (HUMALOG) 100 UNIT/ML injection Inject 0-100 Units into the skin 3 (three) times daily before meals.    [provider]  LORazepam (ATIVAN) 0.5 MG tablet Take 1 tablet (0.5 mg total) by mouth every 8 (eight) hours as needed. 04/04/22   Arnetha Courser, MD  metFORMIN (GLUCOPHAGE-XR) 500 MG 24 hr tablet Take 500 mg by mouth daily with breakfast. 11/11/21   [provider]  modafinil (PROVIGIL) 100 MG tablet Take 100 mg by mouth daily.    [provider]  Multiple Vitamin (MULTI-VITAMINS) TABS Take by mouth. Patient not taking: Reported on 03/30/2022    [provider]  [facility] VERIO test strip 3 (three) times daily. 08/10/21   [provider]  ramipril (ALTACE) 5 MG capsule Take 5 mg by mouth daily. 05/20/20   [provider]  sertraline (ZOLOFT) 25 MG tablet Take 25 mg by mouth daily. 03/26/22   [provider]  tamsulosin (FLOMAX) 0.4 MG CAPS  capsule Take 0.4 mg by mouth daily. 01/28/22   [provider]  trospium (SANCTURA) 20 MG tablet Take 1 tablet (20 mg total) by mouth 2 (two) times daily. 01/30/22   Riki Altes, MD     Critical care time: 60 minutes      Zada Girt, AGNP  Pulmonary/Critical Care Pager [phone] (please enter 7 digits) PCCM Consult Pager [phone] (please enter 7 digits)

## 2022-06-10 NOTE — Consult Note (Signed)
Neurosurgery-New Consultation Evaluation 06/10/2022 Philip Carpenter [id number]  Identifying Statement: Philip Carpenter. is a 77 y.o. male from [place] [address] with fall  Physician Requesting Consultation: [facility] ED  History of Present Illness: Philip Carpenter is here for evaluation after a fall earlier and wife says he likely tripped. He never lost consciousness and has remained at neurological baseline. He has dementia at baseline but otherwise no deficits. She does state that he does have problems walking and uses a cane. In the ED, he denies any head pain. CT of the head shows a small left parietal IPH and subgaleal hematoma. Neurosurgery is consulted for evaluation   Past Medical History:  Past Medical History:  Diagnosis Date   Asthma    Diabetes mellitus without complication    Hyperlipidemia    Hypertension    Melanoma of scalp    Obesity    Stroke     Social History: Social History   Socioeconomic History   Marital status: Married    Spouse name: Not on file   Number of children: Not on file   Years of education: Not on file   Highest education level: Not on file  Occupational History   Not on file  Tobacco Use   Smoking status: Never   Smokeless tobacco: Never  Vaping Use   Vaping Use: Never used  Substance and Sexual Activity   Alcohol use: No   Drug use: No   Sexual activity: Not on file  Other Topics Concern   Not on file  Social History Narrative   Not on file   Social Determinants of Health   Financial Resource Strain: Not on file  Food Insecurity: No Food Insecurity (03/30/2022)   Hunger Vital Sign    Worried About Running Out of Food in the Last Year: Never true    Ran Out of Food in the Last Year: Never true  Transportation Needs: No Transportation Needs (03/30/2022)   PRAPARE - Administrator, Civil Service (Medical): No    Lack of Transportation (Non-Medical): No  Physical Activity: Not on file  Stress: Not on  file  Social Connections: Not on file  Intimate Partner Violence: Not At Risk (03/30/2022)   Humiliation, Afraid, Rape, and Kick questionnaire    Fear of Current or Ex-Partner: No    Emotionally Abused: No    Physically Abused: No    Sexually Abused: No    Family History: Family History  Problem Relation Age of Onset   Prostate cancer Neg Hx    Bladder Cancer Neg Hx    Kidney cancer Neg Hx     Review of Systems:  Review of Systems - General ROS: Negative Psychological ROS: Negative Ophthalmic ROS: Negative ENT ROS: Negative Hematological and Lymphatic ROS: Negative  Endocrine ROS: Negative Respiratory ROS: Negative Cardiovascular ROS: Negative Gastrointestinal ROS: Negative Genito-Urinary ROS: Negative Musculoskeletal ROS: Negative Neurological ROS: Negative for headache Dermatological ROS: Negative  Physical Exam: BP (!) 140/65   Pulse 64   Temp 98.7 F (37.1 C) (Oral)   Resp (!) 8   Ht  (1.778 m)   Wt 90.4 kg   SpO2 97%   BMI 28.60 kg/m  Body mass index is 28.6 kg/m. Body surface area is 2.11 meters squared. General appearance: Alert, cooperative, in no acute distress Head: Normocephalic, subgaleal hematoma noted posteriorly Eyes: Normal, EOM intact Oropharynx: Moist without lesions  Neurologic exam:  Mental status: alertness: alert, orientation: not oriented to  place or year (baseline) affect: normal Speech: fluent and clear, names and repeats Cranial nerves:  II: Visual fields are full by confrontation, no ptosis III/IV/VI: extra-ocular motions intact bilaterally V/VII:no evidence of facial droop or weakness  VIII: hearing normal XI: trapezius strength symmetric,  sternocleidomastoid strength symmetric Motor:strength symmetric 5/5 in bilateral arms. Moves legs and antigravity but wont follow commands consistently  Sensory: intact to pain in all extremities  Laboratory: Results for orders placed or performed during the hospital encounter of  06/10/22  CBC  Result Value Ref Range   WBC 14.0 (H) 4.0 - 10.5 K/uL   RBC 4.02 (L) 4.22 - 5.81 MIL/uL   Hemoglobin 13.1 13.0 - 17.0 g/dL   HCT 62.9 52.8 - 41.3 %   MCV 100.0 80.0 - 100.0 fL   MCH 32.6 26.0 - 34.0 pg   MCHC 32.6 30.0 - 36.0 g/dL   RDW 24.4 01.0 - 27.2 %   Platelets 195 150 - 400 K/uL   nRBC 0.0 0.0 - 0.2 %  Comprehensive metabolic panel  Result Value Ref Range   Sodium 139 135 - 145 mmol/L   Potassium 4.0 3.5 - 5.1 mmol/L   Chloride 106 98 - 111 mmol/L   CO2 24 22 - 32 mmol/L   Glucose, Bld 69 (L) 70 - 99 mg/dL   BUN 15 8 - 23 mg/dL   Creatinine, Ser 5.36 0.61 - 1.24 mg/dL   Calcium 8.3 (L) 8.9 - 10.3 mg/dL   Total Protein 6.2 (L) 6.5 - 8.1 g/dL   Albumin 3.2 (L) 3.5 - 5.0 g/dL   AST 43 (H) 15 - 41 U/L   ALT 30 0 - 44 U/L   Alkaline Phosphatase 47 38 - 126 U/L   Total Bilirubin 1.2 0.3 - 1.2 mg/dL   GFR, Estimated >64 >40 mL/min   Anion gap 9 5 - 15  Protime-INR  Result Value Ref Range   Prothrombin Time 14.1 11.4 - 15.2 seconds   INR 1.1 0.8 - 1.2  Glucose, capillary  Result Value Ref Range   Glucose-Capillary 85 70 - 99 mg/dL   I personally reviewed labs  Imaging: CT Head:  Interval increase in size of the acute intraparenchymal hematoma in the periventricular left parieto-occipital region, now measuring 2.7 x 2.2 x 2.1 cm (estimated volume of 6.2 mL). Mild regional mass effect. No significant midline shift. New/interval intraventricular extension of hemorrhage with small volume of hemorrhage layering in the occipital horn left lateral ventricle. No evidence of acute large vascular territory infarct on this motion limited study. No visible mass lesion. No hydrocephalus. Ex vacuo ventricular dilation with cerebral atrophy.   Impression/Plan:  Philip Carpenter is here with traumatic ICH. This is overall small and he is at neurological baseline. He is on 81mg  ASA and we will hold for a week. We will repeat CT later today and admit for observation and BP  management. If stable tomorrow, we can discuss discharge at that time.    1.  Diagnosis: Traumatic ICH   2.  Plan No surgery indicated Will control BP and follow up serial imaging

## 2022-06-10 NOTE — Progress Notes (Signed)
Progress Note  Notified by ED RN pt had an unwitnessed fall in the ER.  Stat CT Head/Cervical Spine pending.  Due to concern of aspiration CXR also pending. Will continue to monitor and assess pt   Zada Girt, [facility]  Pulmonary/Critical Care Pager [phone] (please enter 7 digits) PCCM Consult Pager [phone] (please enter 7 digits)

## 2022-06-10 NOTE — ED Notes (Signed)
Pt to CT

## 2022-06-11 ENCOUNTER — Inpatient Hospital Stay: Payer: Medicare HMO

## 2022-06-11 DIAGNOSIS — I629 Nontraumatic intracranial hemorrhage, unspecified: Secondary | ICD-10-CM | POA: Diagnosis not present

## 2022-06-11 DIAGNOSIS — I10 Essential (primary) hypertension: Secondary | ICD-10-CM | POA: Diagnosis not present

## 2022-06-11 LAB — GLUCOSE, CAPILLARY
Glucose-Capillary: 125 mg/dL — ABNORMAL HIGH (ref 70–99)
Glucose-Capillary: 131 mg/dL — ABNORMAL HIGH (ref 70–99)
Glucose-Capillary: 131 mg/dL — ABNORMAL HIGH (ref 70–99)
Glucose-Capillary: 145 mg/dL — ABNORMAL HIGH (ref 70–99)
Glucose-Capillary: 173 mg/dL — ABNORMAL HIGH (ref 70–99)
Glucose-Capillary: 192 mg/dL — ABNORMAL HIGH (ref 70–99)
Glucose-Capillary: 198 mg/dL — ABNORMAL HIGH (ref 70–99)

## 2022-06-11 LAB — BASIC METABOLIC PANEL
Anion gap: 6 (ref 5–15)
BUN: 18 mg/dL (ref 8–23)
CO2: 29 mmol/L (ref 22–32)
Calcium: 8.4 mg/dL — ABNORMAL LOW (ref 8.9–10.3)
Chloride: 105 mmol/L (ref 98–111)
Creatinine, Ser: 0.58 mg/dL — ABNORMAL LOW (ref 0.61–1.24)
GFR, Estimated: >60 mL/min (ref >60)
Glucose, Bld: 127 mg/dL — ABNORMAL HIGH (ref 70–99)
Potassium: 3.5 mmol/L (ref 3.5–5.1)
Sodium: 140 mmol/L (ref 135–145)

## 2022-06-11 LAB — MAGNESIUM: Magnesium: 1.7 mg/dL (ref 1.7–2.4)

## 2022-06-11 LAB — CBC
HCT: 37.3 % — ABNORMAL LOW (ref 39.0–52.0)
Hemoglobin: 12.2 g/dL — ABNORMAL LOW (ref 13.0–17.0)
MCH: 32.5 pg (ref 26.0–34.0)
MCHC: 32.7 g/dL (ref 30.0–36.0)
MCV: 99.5 fL (ref 80.0–100.0)
Platelets: 186 10*3/uL (ref 150–400)
RBC: 3.75 MIL/uL — ABNORMAL LOW (ref 4.22–5.81)
RDW: 14.6 % (ref 11.5–15.5)
WBC: 11.3 10*3/uL — ABNORMAL HIGH (ref 4.0–10.5)
nRBC: 0 % (ref 0.0–0.2)

## 2022-06-11 LAB — LIPASE, BLOOD: Lipase: 18 U/L (ref 11–51)

## 2022-06-11 LAB — PHOSPHORUS: Phosphorus: 3.5 mg/dL (ref 2.5–4.6)

## 2022-06-11 LAB — MRSA NEXT GEN BY PCR, NASAL: MRSA by PCR Next Gen: NOT DETECTED

## 2022-06-11 MED ORDER — SODIUM CHLORIDE 0.9 % IV SOLN
12.5000 mg | Freq: Four times a day (QID) | INTRAVENOUS | Status: DC | PRN
  Administered 2022-06-11 – 2022-06-14 (×2): 12.5 mg via INTRAVENOUS
  Filled 2022-06-11: qty 0.5
  Filled 2022-06-11: qty 12.5
  Filled 2022-06-11: qty 0.5

## 2022-06-11 MED ORDER — ONDANSETRON HCL 4 MG/2ML IJ SOLN
4.0000 mg | Freq: Four times a day (QID) | INTRAMUSCULAR | Status: DC | PRN
  Administered 2022-06-15: 8 mg via INTRAVENOUS
  Filled 2022-06-11: qty 4

## 2022-06-11 MED ORDER — FESOTERODINE FUMARATE ER 4 MG PO TB24
4.0000 mg | ORAL_TABLET | Freq: Every day | ORAL | Status: DC
  Administered 2022-06-11 – 2022-06-16 (×5): 4 mg via ORAL
  Filled 2022-06-11 (×6): qty 1

## 2022-06-11 MED ORDER — RAMIPRIL 5 MG PO CAPS
5.0000 mg | ORAL_CAPSULE | Freq: Every day | ORAL | Status: DC
  Administered 2022-06-11 – 2022-06-12 (×2): 5 mg via ORAL
  Filled 2022-06-11 (×4): qty 1

## 2022-06-11 MED ORDER — POTASSIUM CHLORIDE 10 MEQ/100ML IV SOLN
10.0000 meq | INTRAVENOUS | Status: AC
  Administered 2022-06-11 (×4): 10 meq via INTRAVENOUS
  Filled 2022-06-11 (×4): qty 100

## 2022-06-11 MED ORDER — DIVALPROEX SODIUM 125 MG PO DR TAB
125.0000 mg | DELAYED_RELEASE_TABLET | Freq: Two times a day (BID) | ORAL | Status: DC
  Administered 2022-06-11 – 2022-06-16 (×10): 125 mg via ORAL
  Filled 2022-06-11 (×11): qty 1

## 2022-06-11 MED ORDER — DONEPEZIL HCL 5 MG PO TABS
10.0000 mg | ORAL_TABLET | Freq: Every day | ORAL | Status: DC
  Administered 2022-06-11 – 2022-06-16 (×5): 10 mg via ORAL
  Filled 2022-06-11 (×6): qty 2

## 2022-06-11 MED ORDER — CHLORHEXIDINE GLUCONATE CLOTH 2 % EX PADS
6.0000 | MEDICATED_PAD | Freq: Every day | CUTANEOUS | Status: DC
  Administered 2022-06-11 – 2022-06-12 (×2): 6 via TOPICAL

## 2022-06-11 MED ORDER — MAGNESIUM SULFATE 2 GM/50ML IV SOLN
2.0000 g | Freq: Once | INTRAVENOUS | Status: AC
  Administered 2022-06-11: 2 g via INTRAVENOUS
  Filled 2022-06-11: qty 50

## 2022-06-11 MED ORDER — PANTOPRAZOLE SODIUM 40 MG PO TBEC
40.0000 mg | DELAYED_RELEASE_TABLET | Freq: Two times a day (BID) | ORAL | Status: DC
  Administered 2022-06-11 – 2022-06-16 (×9): 40 mg via ORAL
  Filled 2022-06-11 (×10): qty 1

## 2022-06-11 MED ORDER — BACITRACIN-NEOMYCIN-POLYMYXIN OINTMENT TUBE
TOPICAL_OINTMENT | Freq: Every day | CUTANEOUS | Status: DC
  Administered 2022-06-12 – 2022-06-16 (×2): 1 via TOPICAL
  Filled 2022-06-11: qty 14.17

## 2022-06-11 MED ORDER — SERTRALINE HCL 50 MG PO TABS
50.0000 mg | ORAL_TABLET | Freq: Every day | ORAL | Status: DC
  Administered 2022-06-11 – 2022-06-16 (×5): 50 mg via ORAL
  Filled 2022-06-11 (×5): qty 1

## 2022-06-11 MED ORDER — SODIUM CHLORIDE 0.9 % IV SOLN: INTRAVENOUS | Status: AC

## 2022-06-11 MED ORDER — TAMSULOSIN HCL 0.4 MG PO CAPS
0.4000 mg | ORAL_CAPSULE | Freq: Every day | ORAL | Status: DC
  Administered 2022-06-11 – 2022-06-16 (×5): 0.4 mg via ORAL
  Filled 2022-06-11 (×5): qty 1

## 2022-06-11 MED ORDER — FINASTERIDE 5 MG PO TABS
5.0000 mg | ORAL_TABLET | Freq: Every day | ORAL | Status: DC
  Administered 2022-06-11 – 2022-06-16 (×5): 5 mg via ORAL
  Filled 2022-06-11 (×5): qty 1

## 2022-06-11 MED ORDER — ATORVASTATIN CALCIUM 20 MG PO TABS
10.0000 mg | ORAL_TABLET | Freq: Every day | ORAL | Status: DC
  Administered 2022-06-11 – 2022-06-16 (×5): 10 mg via ORAL
  Filled 2022-06-11 (×5): qty 1

## 2022-06-11 MED ORDER — MODAFINIL 100 MG PO TABS
100.0000 mg | ORAL_TABLET | Freq: Every day | ORAL | Status: DC
  Administered 2022-06-11 – 2022-06-16 (×5): 100 mg via ORAL
  Filled 2022-06-11 (×5): qty 1

## 2022-06-11 NOTE — Progress Notes (Signed)
[facility] ADULT ICU REPLACEMENT PROTOCOL   The patient does apply for the [facility] Adult ICU Electrolyte Replacment Protocol based on the criteria listed below:   1.Exclusion criteria: TCTS, ECMO, Dialysis, and Myasthenia Gravis patients 2. Is GFR >/= 30 ml/min? Yes.    Patient's GFR today is >60 3. Is SCr </= 2? Yes.   Patient's SCr is 0.58 mg/dL 4. Did SCr increase >/= 0.5 in 24 hours? No. 5.Pt's weight >40kg  Yes.   6. Abnormal electrolyte(s): K+ 3.5, Maag 1.7  7. Electrolytes replaced per protocol 8.  Call MD STAT for K+ </= 2.5, Phos </= 1, or Mag </= 1 Physician:  Dr. Duncan Dull, Lilia Argue 06/11/2022 5:31 AM

## 2022-06-11 NOTE — Progress Notes (Signed)
OT Cancellation Note  Patient Details Name: Philip Carpenter. MRN: [id number] DOB: [birth year]   Cancelled Treatment:    Reason Eval/Treat Not Completed: Patient not medically ready;Active bedrest order. OT orders received, chart reviewed. Pt noted to have active bedrest orders and MD recommends holding OT for now. Will re-attempt evaluation as pt medically appropriate.  Gerrie Nordmann 06/11/2022, 8:19 AM

## 2022-06-11 NOTE — Progress Notes (Signed)
eLink Physician-Brief Progress Note Patient Name: Philip Carpenter. DOB: 1945/04/07 MRN: [id number]   Date of Service  06/11/2022  HPI/Events of Note  Asking for mainatance fluid.  Discussed with RN.  Small ICB from fall , on room air. Mental status is GCS 14. Confused on and off. Sugar is ok. Dementia.  Neurosurgery on board, following his CT head.   eICU Interventions  Start Saline at 50 ml/hr for now. DM, goal < 180 RN going to notify earlier CT head follow through with Neurosurgery team.     Intervention Category Intermediate Interventions: Other:  Ranee Gosselin 06/11/2022, 1:45 AM

## 2022-06-11 NOTE — Progress Notes (Signed)
PHARMACY CONSULT NOTE  Pharmacy Consult for Electrolyte Monitoring and Replacement   Recent Labs: Potassium (mmol/L)  Date Value  06/11/2022 3.5  12/30/2012 4.0   Magnesium (mg/dL)  Date Value  21/97/5883 1.7   Calcium (mg/dL)  Date Value  25/49/8264 8.4 (L)   Calcium, Total (mg/dL)  Date Value  15/83/0940 8.9   Albumin (g/dL)  Date Value  76/80/8811 3.2 (L)  12/27/2012 3.4   Phosphorus (mg/dL)  Date Value  05/11/9456 3.5   Sodium (mmol/L)  Date Value  06/11/2022 140  12/30/2012 138    Assessment: 77 year old male presented to ED after mechanical fall. Being admitted to CCU with acute intraparenchymal hematoma. PMH asthma, diabetes, hypertension, hyperlipidemia, prior CVA. Pharmacy has been consulted for management of electrolytes.  On NS @ 50 ml/hr.   Goal of Therapy:  Electrolytes within normal limits  Plan:  Kcl 10 mEq IV x 4 and Mg 2 g IV x 1ordered by medical team.  Follow up electrolytes tomorrow AM   Paschal Dopp, PharmD, BCPS Clinical Pharmacist  06/11/2022 7:45 AM

## 2022-06-11 NOTE — Progress Notes (Signed)
Patients repeat CT scan is stable, no surgical intervention needed. He will need to have blood pressure restrictions relaxed and is not restricted at this time. Once he is off all medication, will reevaluate neurologic exam at that time for possible discharge from the hospital.  He is clear from our perspective for ambulation with assistance and can have a diet.

## 2022-06-11 NOTE — Plan of Care (Signed)
Continuing with plan of care. 

## 2022-06-11 NOTE — Progress Notes (Signed)
   Progress Note   Date: 06/11/2022  Subjective: Patient denies headache this morning. Had some nausea but has no new symptoms. Is taking a liquid diet. Still on nicardipine.   Repeat CT head overnight was stable.   Vital Signs: Temp:  [97.9 F (36.6 C)-98.8 F (37.1 C)] 98.8 F (37.1 C) (04/14 1200) Pulse Rate:  [57-102] 75 (04/14 1530) Resp:  [8-34] 16 (04/14 1530) BP: (89-159)/(40-131) 119/64 (04/14 1530) SpO2:  [85 %-100 %] 97 % (04/14 1530) FiO2 (%):  [28 %] 28 % (04/14 1530) Weight:  [90.4 kg-90.8 kg] 90.8 kg (04/14 0313) Temp (24hrs), Avg:98.6 F (37 C), Min:97.9 F (36.6 C), Max:98.8 F (37.1 C)  Weight: 90.8 kg  Problem List Patient Active Problem List   Diagnosis Date Noted   Intraparenchymal hematoma of brain 06/10/2022   Cystitis 04/04/2022   Senile dementia with behavioral disturbance 04/02/2022   Urinary tract infection without hematuria 03/30/2022   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   Prostate cancer screening 01/25/2016   Enlarged prostate with lower urinary tract symptoms (LUTS) 01/25/2016   Morbid obesity with BMI of 45.0-49.9, adult 04/27/2015   Uncontrolled type 2 diabetes mellitus with hyperglycemia, with long-term current use of insulin 04/27/2015   Diabetes 08/18/2013   DOE (dyspnea on exertion) 08/18/2013   HTN (hypertension) 08/18/2013   Hyperlipidemia, unspecified 08/18/2013   Obesity 08/18/2013    Medications: Scheduled Meds:  atorvastatin  10 mg Oral Daily   Chlorhexidine Gluconate Cloth  6 each Topical Daily   divalproex  125 mg Oral BID   donepezil  10 mg Oral Daily   fesoterodine  4 mg Oral Daily   finasteride  5 mg Oral Daily   modafinil  100 mg Oral Daily   neomycin-bacitracin-polymyxin   Topical Daily   mouth rinse  15 mL Mouth Rinse 4 times per day   pantoprazole  40 mg Oral BID   ramipril  5 mg Oral Daily   sertraline  50 mg Oral Daily   tamsulosin  0.4 mg Oral Daily   Continuous Infusions:  niCARDipine 1.5 mg/hr  (06/11/22 1500)   PRN Meds:.acetaminophen, docusate sodium, ondansetron (ZOFRAN) IV, mouth rinse, polyethylene glycol  Labs:  Lab Results  Component Value Date   WBC 11.3 (H) 06/11/2022   WBC 14.0 (H) 06/10/2022   HCT 37.3 (L) 06/11/2022   HCT 40.2 06/10/2022   HCT 42.3 12/30/2012   HCT 42.2 12/27/2012   PLT 186 06/11/2022   PLT 195 06/10/2022   PLT 179 12/30/2012   PLT 184 12/27/2012    Lab Results  Component Value Date   INR 1.1 06/10/2022    Lab Results  Component Value Date   NA 140 06/11/2022   NA 139 06/10/2022   NA 138 12/30/2012   NA 139 12/27/2012   K 3.5 06/11/2022   K 4.0 06/10/2022   K 4.0 12/30/2012   K 4.1 12/27/2012   BUN 18 06/11/2022   BUN 15 06/10/2022   BUN 15 12/30/2012   BUN 17 12/27/2012    Lab Results  Component Value Date   MG 1.7 06/11/2022    A/P: Would recommend normalizing Bps. Patient would benefit from PT.   - Hold ASA for 7 days - Follow up as outpatient in 2-3 weeks with repeat CT    Lucy Chris, MD

## 2022-06-11 NOTE — Progress Notes (Addendum)
NAME:  Philip Abbatiello., MRN:  [id number], DOB:  1946-02-12, LOS: 1 ADMISSION DATE:  06/10/2022, CONSULTATION DATE: 06/10/2022 REFERRING MD: Dr. Lenard Lance , CHIEF COMPLAINT: Fall   History of Present Illness:  This is a 77 yo male who presented to [facility] ER on 04/13 following a mechanical fall.  Per ER notes pts wife reported the pt fell backwards and hit the back of his head while getting out of a truck.  He does not take anticoagulation, but does take aspirin 81 mg daily.    ED Course Upon arrival to the ER pt noted to have a moderate size hematoma on the right occipital scalp.  He also had a laceration on the dorsal aspect of his right hand requiring 1-2 sutures.  CT Head revealed an acute intraparenchymal hematoma within the periventricular white matter of the left parietoccipital region measuring 16 x 10 x 12 mm with mild adjacent edema.  ER provider consulted Neurosurgeon Dr. Adriana Simas who recommended admission to ICU for ongoing monitoring, and repeating CT Head in 6hrs.  PCCM team contacted for ICU admission.  Significant labs: glucose 69/calcium 8.3/albumin 3.2/AST 43/wbc 14.0  CT Head/Cervical Spine: Acute intraparenchymal hematoma within the periventricular white matter of the left parietooccipital region measuring 16 x 10 x 12 mm with mild adjacent edema. No significant mass effect or midline shift. Large scalp hematoma overlying the right parietal and occipital regions. No underlying adrenal fracture. No acute fracture or traumatic listhesis of the cervical spine.  Pertinent  Medical History  Asthma Type II Diabetes Mellitus  HLD HTN  Melanoma of Scalp Obesity  CVA   Significant Hospital Events: Including procedures, antibiotic start and stop dates in addition to other pertinent events   04/13: Pt admitted to ICU with acute intraparenchymal hematoma and large scalp hematoma overlying the right parietal and occipital regions for closer monitoring  04/13: Pt had an unwitnessed  fall in the ER repeat CT Head revealed interval        increase in size of the acute intraparenchymal hematoma in the periventricular        left parieto-occipital region now measuring up to 2.7 cm. New/interval        intraventricular extension of hemorrhage with small volume of hemorrhage        layering in the occipital horn left lateral ventricle.   04/13: CT Cervical revealed no evidence of acute fracture or traumatic        malalignment.  04/13: CT Head at 2300: Relatively stable size and morphology of            Intraparenchymal hematoma centered at the parasagittal posterior left frontal        region. Mild surrounding edema without significant regional mass effect or       midline shift. Intraventricular extension with blood layering within the left       greater than right occipital horns of both lateral ventricles, mildly increased from        prior. Ventricular size remains relatively stable without progressive hydrocephalus.       Large evolving right parietal scalp hematoma. No other new acute intracranial        abnormality. 04/14: Pt with nausea/vomiting low dose nicardipine gtt initiated to maintain sbp <140.  Awaiting neurosurgery recommendations   Interim History / Subjective:  As outlined above under significant events   Objective   Blood pressure (!) 140/75, pulse 71, temperature 98.6 F (37 C), temperature source Oral, resp. rate 18, height   (1.778 m), weight 90.8 kg, SpO2 96 %.    FiO2 (%):  [28 %] 28 %   Intake/Output Summary (Last 24 hours) at 06/11/2022 0730 Last data filed at 06/11/2022 0600 Gross per 24 hour  Intake 216.75 ml  Output 450 ml  Net -233.25 ml   Filed Weights   06/10/22 1811 06/11/22 0313  Weight: 90.4 kg 90.8 kg    Examination: General: Acute on chronically-ill appearing elderly male, having intermittent emesis  HENT: Large hematoma dorsal aspect of head (see below), no JVD  Lungs: Faint rhonchi throughout, even, non labored   Cardiovascular: NSR, s1s2, no r/g, 2+ radial/1+ distal pulses, no edema   Abdomen: +BS x4, obese, non tender, non distended  Extremities: Generalized weakness, moves all extremities  Skin: Right hand laceration, large hematoma on the occipital region      Neuro: Awake and oriented to self only, following commands, neurological exam very limited due to dementia hx, PERRLA  GU: Voiding   Resolved Hospital Problem list     Assessment & Plan:  #Acute intraparenchymal hematoma secondary to mechanical fall  #HTN  Hx: CVA and Dementia  - Continuous telemetry monitoring  - NIH stroke scale q1hr for now  - Keep HOB elevated at 30 degrees  - Nicardipine gtt to maintain sbp 130-140 - Stat CT Head for acute neurological changes  - Neurosurgery consulted appreciate input: no indication for surgical intervention at this time  - Avoid chemical VTE prophylaxis  - Coag results pending  - Trend CBC  - Fall precautions  - PT/OT   #Asthma~stable  - Supplemental O2 for dyspnea and/or hypoxia  - Prn bronchodilator therapy   #Hypomagnesia  - Trend BMP - Replace electrolytes as indicated  - Monitor UOP   #Nausea/vomiting  - Prn zofran  - Continue PPI   #Type II diabetes mellitus  - CBG's q4hrs  - Follow hyper/hypoglycemic protocol  - Hemoglobin A1c pending   Best Practice (right click and "Reselect all SmartList Selections" daily)   Diet/type: NPO DVT prophylaxis: SCD GI prophylaxis: PPI Lines: N/A Foley:  N/A Code Status:  DNR Last date of multidisciplinary goals of care discussion [06/11/22] Palliative Care consulted to discuss goals of care 06/11/22  04/13: Discussed pts condition and current plan of care with pts wife at bedside.  Labs   CBC: Recent Labs  Lab 06/10/22 1441 06/11/22 0405  WBC 14.0* 11.3*  HGB 13.1 12.2*  HCT 40.2 37.3*  MCV 100.0 99.5  PLT 195 186    Basic Metabolic Panel: Recent Labs  Lab 06/10/22 1441 06/11/22 0405  NA 139 140  K 4.0 3.5   CL 106 105  CO2 24 29  GLUCOSE 69* 127*  BUN 15 18  CREATININE 0.64 0.58*  CALCIUM 8.3* 8.4*  MG  --  1.7  PHOS  --  3.5   GFR: Estimated Creatinine Clearance: 89 mL/min (A) (by C-G formula based on SCr of 0.58 mg/dL (L)). Recent Labs  Lab 06/10/22 1441 06/11/22 0405  WBC 14.0* 11.3*    Liver Function Tests: Recent Labs  Lab 06/10/22 1441  AST 43*  ALT 30  ALKPHOS 47  BILITOT 1.2  PROT 6.2*  ALBUMIN 3.2*   No results for input(s): "LIPASE", "AMYLASE" in the last 168 hours. No results for input(s): "AMMONIA" in the last 168 hours.  ABG No results found for: "PHART", "PCO2ART", "PO2ART", "HCO3", "TCO2", "ACIDBASEDEF", "O2SAT"   Coagulation Profile: Recent Labs  Lab 06/10/22 1553  INR 1.1  Cardiac Enzymes: No results for input(s): "CKTOTAL", "CKMB", "CKMBINDEX", "TROPONINI" in the last 168 hours.  HbA1C: Hemoglobin A1C  Date/Time Value Ref Range Status  12/30/2012 05:41 AM 9.0 (H) 4.2 - 6.3 % Final    Comment:    The American Diabetes Association recommends that a primary goal of therapy should be <7% and that physicians should reevaluate the treatment regimen in patients with HbA1c values consistently >8%.    Hgb A1c MFr Bld  Date/Time Value Ref Range Status  03/30/2022 11:10 AM 5.6 4.8 - 5.6 % Final    Comment:    (NOTE) Pre diabetes:          5.7%-6.4%  Diabetes:              >6.4%  Glycemic control for   <7.0% adults with diabetes     CBG: Recent Labs  Lab 06/10/22 1755 06/10/22 2022 06/11/22 0021 06/11/22 0355  GLUCAP 85 115* 131* 131*    Review of Systems: Positives in BOLD   Gen: Denies fever, chills, weight change, fatigue, night sweats HEENT: Denies blurred vision, double vision, hearing loss, tinnitus, sinus congestion, rhinorrhea, sore throat, neck stiffness, dysphagia PULM: Denies shortness of breath, cough, sputum production, hemoptysis, wheezing CV: Denies chest pain, edema, orthopnea, paroxysmal nocturnal dyspnea,  palpitations GI: abdominal pain, nausea, vomiting, diarrhea, hematochezia, melena, constipation, change in bowel habits GU: Denies dysuria, hematuria, polyuria, oliguria, urethral discharge Endocrine: Denies hot or cold intolerance, polyuria, polyphagia or appetite change Derm: Denies rash, dry skin, scaling or peeling skin change Heme: Denies easy bruising, bleeding, bleeding gums Neuro: Denies headache, numbness, weakness, slurred speech, loss of memory or consciousness  Past Medical History:  He,  has a past medical history of Asthma, Diabetes mellitus without complication, Hyperlipidemia, Hypertension, Melanoma of scalp, Obesity, and Stroke.   Surgical History:   Past Surgical History:  Procedure Laterality Date   CHOLECYSTECTOMY     colonoscopy     COLONOSCOPY N/A 02/09/2021   Procedure: COLONOSCOPY;  Surgeon: Toledo, Boykin Nearing, MD;  Location: [facility] END[facility]COPY;  Service: Gastroenterology;  Laterality: N/A;  IDDM (UNCONTROLLED)   COLONOSCOPY WITH PROPOFOL N/A 01/29/2018   Procedure: COLONOSCOPY WITH PROPOFOL;  Surgeon: Toledo, Boykin Nearing, MD;  Location: [facility] ENDOSCOPY;  Service: Gastroenterology;  Laterality: N/A;   right radius fracture repair       Social History:   reports that he has never smoked. He has never used smokeless tobacco. He reports that he does not drink alcohol and does not use drugs.   Family History:  His family history is negative for Prostate cancer, Bladder Cancer, and Kidney cancer.   Allergies Allergies  Allergen Reactions   Metformin Diarrhea    Patient able to tolerate 500 mg XR     Home Medications  Prior to Admission medications   Medication Sig Start Date End Date Taking? Authorizing Provider  aspirin 81 MG chewable tablet Chew 1 tablet (81 mg total) by mouth daily. 04/04/22   Arnetha Courser, MD  atorvastatin (LIPITOR) 10 MG tablet Take 10 mg by mouth daily. 09/06/15   [provider]  divalproex (DEPAKOTE) 125 MG DR tablet Take 125 mg by  mouth as directed. 03/16/22   [provider]  donepezil (ARICEPT) 10 MG tablet Take 10 mg by mouth daily. 11/11/21   [provider]  finasteride (PROSCAR) 5 MG tablet TAKE 1 TABLET (5 MG TOTAL) BY MOUTH DAILY. 01/20/19   Stoioff, Verna Czech, MD  hydrALAZINE (APRESOLINE) 25 MG tablet Take 1 tablet (  25 mg total) by mouth every 8 (eight) hours. 04/04/22   Arnetha Courser, MD  Insulin Glargine (BASAGLAR KWIKPEN) 100 UNIT/ML Inject 40 Units into the skin at bedtime.    [provider]  insulin lispro (HUMALOG) 100 UNIT/ML injection Inject 0-100 Units into the skin 3 (three) times daily before meals.    [provider]  LORazepam (ATIVAN) 0.5 MG tablet Take 1 tablet (0.5 mg total) by mouth every 8 (eight) hours as needed. 04/04/22   Arnetha Courser, MD  metFORMIN (GLUCOPHAGE-XR) 500 MG 24 hr tablet Take 500 mg by mouth daily with breakfast. 11/11/21   [provider]  modafinil (PROVIGIL) 100 MG tablet Take 100 mg by mouth daily.    [provider]  Multiple Vitamin (MULTI-VITAMINS) TABS Take by mouth. Patient not taking: Reported on 03/30/2022    [provider]  [facility] VERIO test strip 3 (three) times daily. 08/10/21   [provider]  ramipril (ALTACE) 5 MG capsule Take 5 mg by mouth daily. 05/20/20   [provider]  sertraline (ZOLOFT) 25 MG tablet Take 25 mg by mouth daily. 03/26/22   [provider]  tamsulosin (FLOMAX) 0.4 MG CAPS capsule Take 0.4 mg by mouth daily. 01/28/22   [provider]  trospium (SANCTURA) 20 MG tablet Take 1 tablet (20 mg total) by mouth 2 (two) times daily. 01/30/22   Riki Altes, MD     Critical care time: 35 minutes      Zada Girt, AGNP  Pulmonary/Critical Care Pager [phone] (please enter 7 digits) PCCM Consult Pager [phone] (please enter 7 digits)

## 2022-06-11 NOTE — Progress Notes (Signed)
Sitter alerted this nurse that patient was bleeding, upon entering the room the patient's IV was removed along with hand mit. Upon cleaning patient he had an emesis episode that was of moderate amount and green in color. Dr. Belia Heman notified and ordered for phenergan and CT scan.

## 2022-06-11 NOTE — Progress Notes (Signed)
PT Cancellation Note  Patient Details Name: Philip Carpenter. MRN: [id number] DOB: Feb 14, 1946   Cancelled Treatment:    Reason Eval/Treat Not Completed: Patient not medically ready PT orders received, chart reviewed. Pt noted to have active bedrest orders & MD recommends holding PT for now. Will f/u as able.  Aleda Grana, PT, DPT 06/11/22, 8:18 AM   Sandi Mariscal 06/11/2022, 8:17 AM

## 2022-06-11 NOTE — Progress Notes (Signed)
Patient escorted to CT scan in hospital bed with cardiac monitoring, transport, and this nurse present.  Patient also had abdominal x-rays during trip as well.  Patient tolerated both procedures and trip back to unit.

## 2022-06-12 ENCOUNTER — Telehealth: Payer: Self-pay

## 2022-06-12 DIAGNOSIS — S06310A Contusion and laceration of right cerebrum without loss of consciousness, initial encounter: Secondary | ICD-10-CM

## 2022-06-12 DIAGNOSIS — I629 Nontraumatic intracranial hemorrhage, unspecified: Principal | ICD-10-CM

## 2022-06-12 LAB — CBC WITH DIFFERENTIAL/PLATELET
Abs Immature Granulocytes: 0.03 10*3/uL (ref 0.00–0.07)
Basophils Absolute: 0.1 10*3/uL (ref 0.0–0.1)
Basophils Relative: 1 %
Eosinophils Absolute: 0.1 10*3/uL (ref 0.0–0.5)
Eosinophils Relative: 1 %
HCT: 37.4 % — ABNORMAL LOW (ref 39.0–52.0)
Hemoglobin: 12 g/dL — ABNORMAL LOW (ref 13.0–17.0)
Immature Granulocytes: 0 %
Lymphocytes Relative: 22 %
Lymphs Abs: 2.1 10*3/uL (ref 0.7–4.0)
MCH: 32.4 pg (ref 26.0–34.0)
MCHC: 32.1 g/dL (ref 30.0–36.0)
MCV: 101.1 fL — ABNORMAL HIGH (ref 80.0–100.0)
Monocytes Absolute: 0.8 10*3/uL (ref 0.1–1.0)
Monocytes Relative: 8 %
Neutro Abs: 6.4 10*3/uL (ref 1.7–7.7)
Neutrophils Relative %: 68 %
Platelets: 169 10*3/uL (ref 150–400)
RBC: 3.7 MIL/uL — ABNORMAL LOW (ref 4.22–5.81)
RDW: 14.6 % (ref 11.5–15.5)
WBC: 9.4 10*3/uL (ref 4.0–10.5)
nRBC: 0 % (ref 0.0–0.2)

## 2022-06-12 LAB — GLUCOSE, CAPILLARY
Glucose-Capillary: 163 mg/dL — ABNORMAL HIGH (ref 70–99)
Glucose-Capillary: 138 mg/dL — ABNORMAL HIGH (ref 70–99)
Glucose-Capillary: 172 mg/dL — ABNORMAL HIGH (ref 70–99)
Glucose-Capillary: 201 mg/dL — ABNORMAL HIGH (ref 70–99)

## 2022-06-12 LAB — BASIC METABOLIC PANEL
Anion gap: 8 (ref 5–15)
BUN: 13 mg/dL (ref 8–23)
CO2: 28 mmol/L (ref 22–32)
Calcium: 8.4 mg/dL — ABNORMAL LOW (ref 8.9–10.3)
Chloride: 105 mmol/L (ref 98–111)
Creatinine, Ser: 0.52 mg/dL — ABNORMAL LOW (ref 0.61–1.24)
GFR, Estimated: >60 mL/min (ref >60)
Glucose, Bld: 164 mg/dL — ABNORMAL HIGH (ref 70–99)
Potassium: 3.7 mmol/L (ref 3.5–5.1)
Sodium: 141 mmol/L (ref 135–145)

## 2022-06-12 LAB — VITAMIN B12: Vitamin B-12: 315 pg/mL (ref 180–914)

## 2022-06-12 LAB — VITAMIN D 25 HYDROXY (VIT D DEFICIENCY, FRACTURES): Vit D, 25-Hydroxy: 28.45 ng/mL — ABNORMAL LOW (ref 30–100)

## 2022-06-12 LAB — MAGNESIUM: Magnesium: 2 mg/dL (ref 1.7–2.4)

## 2022-06-12 LAB — PHOSPHORUS: Phosphorus: 2.4 mg/dL — ABNORMAL LOW (ref 2.5–4.6)

## 2022-06-12 MED ORDER — LORAZEPAM 0.5 MG PO TABS: 0.5000 mg | ORAL_TABLET | Freq: Three times a day (TID) | ORAL | Status: DC | PRN

## 2022-06-12 MED ORDER — INSULIN ASPART 100 UNIT/ML IJ SOLN
2.0000 [IU] | Freq: Once | INTRAMUSCULAR | Status: AC
  Administered 2022-06-12: 2 [IU] via SUBCUTANEOUS
  Filled 2022-06-12: qty 1

## 2022-06-12 MED ORDER — POTASSIUM & SODIUM PHOSPHATES 280-160-250 MG PO PACK
2.0000 | PACK | Freq: Every day | ORAL | Status: AC
  Administered 2022-06-12: 2 via ORAL
  Filled 2022-06-12: qty 2

## 2022-06-12 NOTE — Telephone Encounter (Signed)
Dr Adriana Simas saw this patient as a consult on 06/10/22 for a traumatic intracranial hemorrhage. Per Dr Adriana Simas, he needs a repeat head CT in 2-3 weeks and an appointment with our office after. He will be a new patient to [place]. CT has been ordered (to be done around 06/26/22-). Thanks.

## 2022-06-12 NOTE — TOC Initial Note (Signed)
Transition of Care (TOC) - Initial/Assessment Note    Patient Details  Name: Philip Carpenter. MRN: [id number] Date of Birth: 1945/03/13  Transition of Care [facility]) CM/SW Contact:    Margarito Liner, LCSW Phone Number: 06/12/2022, 4:21 PM  Clinical Narrative:  Patient only oriented to self. CSW called wife, introduced role and explained that discharge planning would be discussed. Wife stated patient was not able to work with therapy well today due to confusion. They plan to come back on Wednesday and she hopes he will do better. Discussed current recommendation for SNF. Patient was at [facility] 2/6-2/24 (18 days) but wife does not want him to return there. Discussed copays after two days at next SNF. Wife unable to pay up front so will look for SNF that will allow them to work out payment plan for after rehab. Wife wants to apply for Medicaid. No further concerns. CSW encouraged patient's wife to contact CSW as needed. CSW will continue to follow patient and his wife for support and facilitate discharge once medically stable.                  Barriers to Discharge: Continued Medical Work up   Patient Goals and CMS Choice            Expected Discharge Plan and Services     Post Acute Care Choice:  (TBD) Living arrangements for the past 2 months: Single Family Home                                      Prior Living Arrangements/Services Living arrangements for the past 2 months: Single Family Home Lives with:: Spouse Patient language and need for interpreter reviewed:: Yes Do you feel safe going back to the place where you live?: Yes      Need for Family Participation in Patient Care: Yes (Comment) Care giver support system in place?: Yes (comment)   Criminal Activity/Legal Involvement Pertinent to Current Situation/Hospitalization: No - Comment as needed  Activities of Daily Living      Permission Sought/Granted Permission sought to share information with :  Facility Medical sales representative, Family Supports    Share Information with NAME: Seith Aikey  Permission granted to share info w AGENCY: SNF's  Permission granted to share info w Relationship: Wife  Permission granted to share info w Contact Information: [phone]  Emotional Assessment Appearance:: Appears stated age Attitude/Demeanor/Rapport: Unable to Assess Affect (typically observed): Unable to Assess Orientation: : Oriented to Self Alcohol / Substance Use: Not Applicable Psych Involvement: No (comment)  Admission diagnosis:  Intracranial hemorrhage [I62.9] Intraparenchymal hematoma of brain [S06.33AA] Patient Active Problem List   Diagnosis Date Noted   Intraparenchymal hematoma of brain 06/10/2022   Cystitis 04/04/2022   Senile dementia with behavioral disturbance 04/02/2022   Urinary tract infection without hematuria 03/30/2022   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   Prostate cancer screening 01/25/2016   Enlarged prostate with lower urinary tract symptoms (LUTS) 01/25/2016   Morbid obesity with BMI of 45.0-49.9, adult 04/27/2015   Uncontrolled type 2 diabetes mellitus with hyperglycemia, with long-term current use of insulin 04/27/2015   Diabetes 08/18/2013   DOE (dyspnea on exertion) 08/18/2013   HTN (hypertension) 08/18/2013   Hyperlipidemia, unspecified 08/18/2013   Obesity 08/18/2013   PCP:  Marguarite Arbour, MD Pharmacy:   [facility] - [place], [place] - [address] [place] [address] Phone: [phone] Fax: [phone]  [facility] - [place], [place] - [address] [place] [address] Phone: [phone] Fax: [phone]  [facility] [id number] [place], [place] - [address] [place] [address] Phone: [phone] Fax: [phone]  [facility] [address], [place] - [address] [place] [address] Phone:  [phone] Fax: [phone]     Social Determinants of Health (SDOH) Social History: SDOH Screenings   Food Insecurity: No Food Insecurity (03/30/2022)  Housing: Low Risk  (03/30/2022)  Transportation Needs: No Transportation Needs (03/30/2022)  Utilities: Not At Risk (03/30/2022)  Tobacco Use: Low Risk  (06/10/2022)   SDOH Interventions:     Readmission Risk Interventions     No data to display

## 2022-06-12 NOTE — Progress Notes (Addendum)
PHARMACY CONSULT NOTE  Pharmacy Consult for Electrolyte Monitoring and Replacement   Recent Labs: Potassium (mmol/L)  Date Value  06/12/2022 3.7  12/30/2012 4.0   Magnesium (mg/dL)  Date Value  50/10/3816 2.0   Calcium (mg/dL)  Date Value  29/93/7169 8.4 (L)   Calcium, Total (mg/dL)  Date Value  67/89/3810 8.9   Albumin (g/dL)  Date Value  17/51/0258 3.2 (L)  12/27/2012 3.4   Phosphorus (mg/dL)  Date Value  52/77/8242 2.4 (L)   Sodium (mmol/L)  Date Value  06/12/2022 141  12/30/2012 138    Assessment: 77 year old male presented to ED after mechanical fall. Being admitted to CCU with acute intraparenchymal hematoma. PMH asthma, diabetes, hypertension, hyperlipidemia, prior CVA. Pharmacy has been consulted for management of electrolytes.  Diet: Clear liquids  Goal of Therapy:  Electrolytes within normal limits  Plan:  --Phosphorous mildly low at 2.4. Will order Phos-Nak 2 packets PO x 1 --Patient care transferred from PCCM to [facility]. Will discontinue electrolyte consult at this time. Defer further ordering of labs and electrolyte replacement to primary team  Tressie Ellis 06/12/2022 7:44 AM

## 2022-06-12 NOTE — NC FL2 (Signed)
[place] MEDICAID FL2 LEVEL OF CARE FORM     IDENTIFICATION  Patient Name: Philip Carpenter. Birthdate: Jan 19, 1946 Sex: male Admission Date (Current Location): 06/10/2022  [facility] and [place] Number:  Chiropodist and Address:  [facility], [address] [place], [place] [address]      Provider Number: [id number]  Attending Physician Name and Address:  Gillis Santa, MD  Relative Name and Phone Number:       Current Level of Care: Hospital Recommended Level of Care: Skilled Nursing Facility Prior Approval Number:    Date Approved/Denied:   PASRR Number: [id number]  Discharge Plan: SNF    Current Diagnoses: Patient Active Problem List   Diagnosis Date Noted   Intraparenchymal hematoma of brain 06/10/2022   Cystitis 04/04/2022   Senile dementia with behavioral disturbance 04/02/2022   Urinary tract infection without hematuria 03/30/2022   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   Prostate cancer screening 01/25/2016   Enlarged prostate with lower urinary tract symptoms (LUTS) 01/25/2016   Morbid obesity with BMI of 45.0-49.9, adult 04/27/2015   Uncontrolled type 2 diabetes mellitus with hyperglycemia, with long-term current use of insulin 04/27/2015   Diabetes 08/18/2013   DOE (dyspnea on exertion) 08/18/2013   HTN (hypertension) 08/18/2013   Hyperlipidemia, unspecified 08/18/2013   Obesity 08/18/2013    Orientation RESPIRATION BLADDER Height & Weight     Self  Normal Incontinent, External catheter Weight: 201 lb 11.5 oz (91.5 kg) Height:  5\' 10"  (177.8 cm)  BEHAVIORAL SYMPTOMS/MOOD NEUROLOGICAL BOWEL NUTRITION STATUS  Other (Comment) (Irritable, anxious)  (Dementia) Continent Diet (Heart healthy)  AMBULATORY STATUS COMMUNICATION OF NEEDS Skin   Extensive Assist Verbally Skin abrasions, Bruising, Other (Comment) (Laceration on right hand: No dressing. Skin tear on left arm: gauze.)                       Personal Care  Assistance Level of Assistance  Bathing, Feeding, Dressing Bathing Assistance: Maximum assistance Feeding assistance: Maximum assistance Dressing Assistance: Maximum assistance     Functional Limitations Info  Sight, Hearing, Speech Sight Info: Adequate Hearing Info: Adequate Speech Info: Adequate    SPECIAL CARE FACTORS FREQUENCY  PT (By licensed PT), OT (By licensed OT)     PT Frequency: 5 x week OT Frequency: 5 x week            Contractures Contractures Info: Not present    Additional Factors Info  Code Status, Allergies Code Status Info: DNR Allergies Info: Metformin           Current Medications (06/12/2022):  This is the current hospital active medication list Current Facility-Administered Medications  Medication Dose Route Frequency Provider Last Rate Last Admin   acetaminophen (TYLENOL) tablet 650 mg  650 mg Oral Q4H PRN Ezequiel Essex, NP   650 mg at 06/11/22 1031   atorvastatin (LIPITOR) tablet 10 mg  10 mg Oral Daily Ezequiel Essex, NP   10 mg at 06/12/22 1017   Chlorhexidine Gluconate Cloth 2 % PADS 6 each  6 each Topical Daily Erin Fulling, MD   6 each at 06/12/22 1018   divalproex (DEPAKOTE) DR tablet 125 mg  125 mg Oral BID Ezequiel Essex, NP   125 mg at 06/12/22 1018   docusate sodium (COLACE) capsule 100 mg  100 mg Oral BID PRN Ezequiel Essex, NP       donepezil (ARICEPT) tablet 10 mg  10 mg Oral Daily Delton See,  Neldon Newport, NP   10 mg at 06/12/22 1018   fesoterodine (TOVIAZ) tablet 4 mg  4 mg Oral Daily Ezequiel Essex, NP   4 mg at 06/12/22 1017   finasteride (PROSCAR) tablet 5 mg  5 mg Oral Daily Ezequiel Essex, NP   5 mg at 06/12/22 1017   LORazepam (ATIVAN) tablet 0.5 mg  0.5 mg Oral Q8H PRN Gillis Santa, MD       modafinil (PROVIGIL) tablet 100 mg  100 mg Oral Daily Ezequiel Essex, NP   100 mg at 06/12/22 1017   neomycin-bacitracin-polymyxin (NEOSPORIN) ointment   Topical Daily Ezequiel Essex, NP   1 Application at 06/12/22 1022   ondansetron (ZOFRAN)  injection 4-8 mg  4-8 mg Intravenous Q6H PRN Erin Fulling, MD       Oral care mouth rinse  15 mL Mouth Rinse 4 times per day Jimmye Norman, NP   15 mL at 06/12/22 1615   Oral care mouth rinse  15 mL Mouth Rinse PRN Jimmye Norman, NP       pantoprazole (PROTONIX) EC tablet 40 mg  40 mg Oral BID Ezequiel Essex, NP   40 mg at 06/12/22 1017   polyethylene glycol (MIRALAX / GLYCOLAX) packet 17 g  17 g Oral Daily PRN Ezequiel Essex, NP       promethazine (PHENERGAN) 12.5 mg in sodium chloride 0.9 % 50 mL IVPB  12.5 mg Intravenous Q6H PRN Erin Fulling, MD   Stopped at 06/11/22 1856   ramipril (ALTACE) capsule 5 mg  5 mg Oral Daily Ezequiel Essex, NP   5 mg at 06/12/22 1017   sertraline (ZOLOFT) tablet 50 mg  50 mg Oral Daily Ezequiel Essex, NP   50 mg at 06/12/22 1017   tamsulosin (FLOMAX) capsule 0.4 mg  0.4 mg Oral Daily Ezequiel Essex, NP   0.4 mg at 06/12/22 1017     Discharge Medications: Please see discharge summary for a list of discharge medications.  Relevant Imaging Results:  Relevant Lab Results:   Additional Information SS#: [id number]. Was at [facility] 2/6-2/24 (18 days).  Margarito Liner, LCSW

## 2022-06-12 NOTE — Progress Notes (Signed)
    Attending Progress Note  History: Philip Hrncir. is here for traumatic intracranial hemorrhage  HD3: No headache.  No change in cognition.  Physical Exam: Vitals:   06/12/22 1700 06/12/22 1800  BP: (!) 125/51 (!) 130/47  Pulse: 88 82  Resp: 11 20  Temp:    SpO2: 96% 98%    AA Ox self.   CNI  Strength:Is uncooperative with exam, but moves all extremities purposefully without obvious deficit.   Data:  Other tests/results: CT Scans reviewed - stable  Assessment/Plan:  Philip Carpenter. Is stable neurologically  - mobilize - pain control - DVT prophylaxis ok to start - PTOT - No surgical intervention needed - No additional scans needed unless he has a change in status - OK for floor status    Venetia Night MD, [facility] Department of Neurosurgery

## 2022-06-12 NOTE — Evaluation (Signed)
Occupational Therapy Evaluation Patient Details Name: Philip Carpenter. MRN: [id number] DOB: [birth year] Today's Date: 06/12/2022   History of Present Illness Pt is a 77 y/o M admitted on 06/10/22 after presenting with c/o falling backwards & hitting head. Head CT showed intraparenchymal hematoma within the periventricular white matter of the left parietoccipital region measuring 16 x 10 x 12 mm with mild adjacent edema. Neurosurgery was consulted & recommended admission to ICU for ongoing monitoring & repeat CT head. PMH: asthma, DM2, HLD, HTN, obesity, CVA   Clinical Impression   Patient presenting with decreased Ind in self care, balance, functional mobility/transfers, endurance, and safety awareness. Patient with cognitive deficits at baseline. Pt's wife, caregiver, present and reports pt is oriented to self only and knows several family members. He lives with his wife and daughter and they care for him at home. Family assists with ADLs and IADLs secondary to cognition. Pt ambulates with walking stick in home for short distances.  Pt needing +2 assistance for bed mobility with R lateral lean once seated on EOB. Pt not following any commands and getting more and more agitated while therapy is present. Pt yelling, "Leave me alone" . Patient will benefit from acute OT to increase overall independence in the areas of ADLs, functional mobility, and safety awareness in order to safely discharge.     Recommendations for follow up therapy are one component of a multi-disciplinary discharge planning process, led by the attending physician.  Recommendations may be updated based on patient status, additional functional criteria and insurance authorization.   Assistance Recommended at Discharge Frequent or constant Supervision/Assistance  Patient can return home with the following Two people to help with bathing/dressing/bathroom;Two people to help with walking and/or transfers;Assistance with  cooking/housework;Assist for transportation;Help with stairs or ramp for entrance;Direct supervision/assist for financial management;Direct supervision/assist for medications management    Functional Status Assessment  Patient has had a recent decline in their functional status and demonstrates the ability to make significant improvements in function in a reasonable and predictable amount of time.  Equipment Recommendations  Other (comment) (defer to next venue)       Precautions / Restrictions Precautions Precautions: Fall Restrictions Weight Bearing Restrictions: No      Mobility Bed Mobility Overal bed mobility: Needs Assistance Bed Mobility: Supine to Sit, Sit to Supine     Supine to sit: Max assist, +2 for physical assistance, HOB elevated Sit to supine: Max assist, +2 for safety/equipment   General bed mobility comments: Pt unable to follow commands for bed mobility    Transfers                   General transfer comment: not attempted secondary to safety concerns and pt agitation      Balance Overall balance assessment: Needs assistance Sitting-balance support: Feet supported Sitting balance-Leahy Scale: Poor   Postural control: Right lateral lean                                 ADL either performed or assessed with clinical judgement   ADL                                         General ADL Comments: Unable to participate as pt is increasingly agitated during session.     Vision Patient Visual Report:  No change from baseline              Pertinent Vitals/Pain Pain Assessment Pain Assessment: Faces Faces Pain Scale: Hurts little more Pain Location: pt states "you're hurting me!" but doesn't state where Pain Descriptors / Indicators: Discomfort Pain Intervention(s): Monitored during session, Repositioned, Limited activity within patient's tolerance        Extremity/Trunk Assessment Upper Extremity  Assessment Upper Extremity Assessment: Difficult to assess due to impaired cognition   Lower Extremity Assessment Lower Extremity Assessment: Difficult to assess due to impaired cognition   Cervical / Trunk Assessment Cervical / Trunk Assessment:  (Pt with R flexed postured (cervical & thoracic spine))   Communication     Cognition Arousal/Alertness: Awake/alert Behavior During Therapy: Agitated Overall Cognitive Status: History of cognitive impairments - at baseline                                 General Comments: Pt responds to name but easily irritated by therapy presence, repeatedly states "leave me alone!"                Home Living Family/patient expects to be discharged to:: Private residence Living Arrangements: Spouse/significant other;Children Available Help at Discharge: Family;Available 24 hours/day Type of Home: House Home Access: Stairs to enter [employer] of Steps: 3 Entrance Stairs-Rails:  (1 rail, unsure which side) Home Layout: One level     Bathroom Shower/Tub: Chief Strategy Officer: Standard     Home Equipment: Agricultural consultant (2 wheels);BSC/3in1;Hand held shower head;Shower seat   Additional Comments: walking stick      Prior Functioning/Environment Prior Level of Function : Needs assist;History of Falls (last six months)             Mobility Comments: Wife reports pt is ambulatory via furniture walking in the home or holding to a walking stick. ADLs Comments: Pt requires some assistance with bathing, dressing, and grooming from family 2/2 baseline cognitive impairments.        OT Problem List: Pain;Decreased cognition;Decreased activity tolerance;Decreased safety awareness;Impaired balance (sitting and/or standing);Decreased knowledge of use of DME or AE      OT Treatment/Interventions: Self-care/ADL training;Therapeutic exercise;Therapeutic activities;Patient/family education;Balance  training;Energy conservation;Neuromuscular education;Cognitive remediation/compensation    OT Goals(Current goals can be found in the care plan section) Acute Rehab OT Goals Patient Stated Goal: to get stronger and go to rehab OT Goal Formulation: With family Time For Goal Achievement: 06/26/22 Potential to Achieve Goals: Good ADL Goals Pt Will Perform Grooming: with min assist;standing Pt Will Perform Lower Body Dressing: with mod assist;sit to/from stand Pt Will Transfer to Toilet: with mod assist;stand pivot transfer;bedside commode Pt Will Perform Toileting - Clothing Manipulation and hygiene: with mod assist;sit to/from stand  OT Frequency: Min 1X/week    Co-evaluation PT/OT/SLP Co-Evaluation/Treatment: Yes Reason for Co-Treatment: Complexity of the patient's impairments (multi-system involvement);Necessary to address cognition/behavior during functional activity;For patient/therapist safety PT goals addressed during session: Mobility/safety with mobility OT goals addressed during session: ADL's and self-care      AM-PAC OT "6 Clicks" Daily Activity     Outcome Measure Help from another person eating meals?: A Lot Help from another person taking care of personal grooming?: Total Help from another person toileting, which includes using toliet, bedpan, or urinal?: Total Help from another person bathing (including washing, rinsing, drying)?: Total Help from another person to put on and taking off regular upper body clothing?:  Total Help from another person to put on and taking off regular lower body clothing?: Total 6 Click Score: 7   End of Session Nurse Communication: Mobility status (O2 removed)  Activity Tolerance: Treatment limited secondary to agitation;Patient limited by pain Patient left: in bed;with call bell/phone within reach;with bed alarm set;Other (comment);with family/visitor present (B hand mitts donned)  OT Visit Diagnosis: Unsteadiness on feet (R26.81);Repeated  falls (R29.6);Muscle weakness (generalized) (M62.81);History of falling (Z91.81);Other (comment) (L frontal lobe hemorrhage)                Time: 4782-9562 OT Time Calculation (min): 13 min Charges:  OT General Charges $OT Visit: 1 Visit OT Evaluation $OT Eval Moderate Complexity: 1 [address] MS, OTR/L , CBIS ascom [phone]  06/12/22, 1:21 PM

## 2022-06-12 NOTE — Progress Notes (Signed)
[facility] Progress Note  Patient: Philip Carpenter.    ESL:[id number]  DOA: 06/10/2022     Date of Service: the patient was seen and examined on 06/12/2022  Chief Complaint  Patient presents with   Fall   Brief hospital course: This is a 77 yo male with PMH of Asthma, Type II Diabetes Mellitus, HLD HTN, Melanoma of Scalp, Obesity, CVA,  who presented to [facility] ER on 04/13 following a mechanical fall. Per ER notes pts wife reported the pt fell backwards and hit the back of his head while getting out of a truck. He does not take anticoagulation, but does take aspirin 81 mg daily.   Upon arrival to the ER pt noted to have a moderate size hematoma on the right occipital scalp. He also had a laceration on the dorsal aspect of his right hand requiring 1-2 sutures. CT Head revealed an acute intraparenchymal hematoma within the periventricular white matter of the left parietoccipital region measuring 16 x 10 x 12 mm with mild adjacent edema. ER provider consulted Neurosurgeon Dr. Adriana Simas who recommended admission to ICU for ongoing monitoring, and repeating CT Head in 6hrs. PCCM team contacted for ICU admission.   CT Head/Cervical Spine: Acute intraparenchymal hematoma within the periventricular white matter of the left parietooccipital region measuring 16 x 10 x 12 mm with mild adjacent edema. No significant mass effect or midline shift. Large scalp hematoma overlying the right parietal and occipital regions. No underlying adrenal fracture. No acute fracture or traumatic listhesis of the cervical spine.   Assessment and Plan:  #Acute intraparenchymal hematoma secondary to mechanical fall  #HTN and Hx: CVA and Dementia  Continuous telemetry monitoring  S/p NIH stroke scale q1hr, changed to neurocheck every 4 hourly  S/p Nicardipine gtt, d/c'd on 4/15, keep MAP <110 as per neurosurgery - Stat CT Head for acute neurological changes  - Neurosurgery consulted appreciate input: no indication for  surgical intervention at this time.  Hold aspirin for 7 days, follow-up in 2 to 3 weeks to repeat CT head as an outpatient - Avoid chemical VTE prophylaxis  - Trend CBC  - Fall precautions  - PT/OT    #Asthma~stable  - Supplemental O2 for dyspnea and/or hypoxia  - Prn bronchodilator therapy    #Hypomagnesia, Mag repleted. #Hypophosphatemia, Phos repleted. - Replace electrolytes as indicated    #Nausea/vomiting  - Prn zofran  - Continue PPI    #Type II diabetes mellitus  - CBG's q4hrs  - Follow hyper/hypoglycemic protocol   Dementia, depression and anxiety Continued home meds Aricept, Zoloft, modafinil,  Ativan as needed for anxiety   BPH, continued Flomax, Proscar and Toviaz    Body mass index is 28.94 kg/m.  Interventions:    Diet: Heart healthy diet DVT Prophylaxis: SCD, pharmacological prophylaxis contraindicated due to bleeding    Advance goals of care discussion: DNR  Family Communication: family was present at bedside, at the time of interview.  The pt provided permission to discuss medical plan with the family. Opportunity was given to ask question and all questions were answered satisfactorily.   Disposition:  Pt is from Home, admitted with Fall and intraparenchymal hemorrhage, still has risk of fall,, which precludes a safe discharge. Discharge to home with [facility] versus SNF, TBD after PT/OT eval, when clinically stable..  Subjective: No significant events overnight.  Patient was sleepy, denies any headache or dizziness, no nausea vomiting, no knee pain. Patient wife was at bedside, all question and concerns answered.  Physical Exam: General: NAD, lying comfortably Appear in no distress, affect appropriate Eyes: PERRLA ENT: Oral Mucosa Clear, moist  Neck: no JVD,  Cardiovascular: S1 and S2 Present, no Murmur,  Respiratory: good respiratory effort, Bilateral Air entry equal and Decreased, no Crackles, no wheezes Abdomen: Bowel Sound present, Soft and no  tenderness,  Skin: no rashes Extremities: no Pedal edema, no calf tenderness Neurologic: without any new focal findings Gait not checked due to patient safety concerns  Vitals:   06/12/22 0900 06/12/22 1000 06/12/22 1100 06/12/22 1200  BP: (!) 111/48 (!) 125/46 (!) 116/45 (!) 112/48  Pulse: 65 (!) 52 (!) 57 67  Resp: 15 (!) Temp:      TempSrc:      SpO2: 97% 97% 93% (!) 89%  Weight:      Height:        Intake/Output Summary (Last 24 hours) at 06/12/2022 1417 Last data filed at 06/12/2022 0535 Gross per 24 hour  Intake 315.49 ml  Output 370 ml  Net -54.51 ml   Filed Weights   06/10/22 1811 06/11/22 0313 06/12/22 0200  Weight: 90.4 kg 90.8 kg 91.5 kg    Data Reviewed: I have personally reviewed and interpreted daily labs, tele strips, imagings as discussed above. I reviewed all nursing notes, pharmacy notes, vitals, pertinent old records I have discussed plan of care as described above with RN and patient/family.  CBC: Recent Labs  Lab 06/10/22 1441 06/11/22 0405 06/12/22 0547  WBC 14.0* 11.3* 9.4  NEUTROABS  --   --  6.4  HGB 13.1 12.2* 12.0*  HCT 40.2 37.3* 37.4*  MCV 100.0 99.5 101.1*  PLT 195 186 169   Basic Metabolic Panel: Recent Labs  Lab 06/10/22 1441 06/11/22 0405 06/12/22 0547  NA 139 140 141  K 4.0 3.5 3.7  CL 106 105 105  CO2 GLUCOSE 69* 127* 164*  BUN CREATININE 0.64 0.58* 0.52*  CALCIUM 8.3* 8.4* 8.4*  MG  --  1.7 2.0  PHOS  --  3.5 2.4*    Studies: CT HEAD WO CONTRAST ( )  Result Date: 06/11/2022 CLINICAL DATA:  Head trauma, moderate-severe Intraparenchymal hematoma EXAM: CT HEAD WITHOUT CONTRAST TECHNIQUE: Contiguous axial images were obtained from the base of the skull through the vertex without intravenous contrast. RADIATION DOSE REDUCTION: This exam was performed according to the departmental dose-optimization program which includes automated exposure control, adjustment of the mA and/or kV according  to patient size and/or use of iterative reconstruction technique. COMPARISON:  CT head 06/10/2022 FINDINGS: Brain: Cerebral ventricle sizes are concordant with the degree of cerebral volume loss. Patchy and confluent areas of decreased attenuation are noted throughout the deep and periventricular white matter of the cerebral hemispheres bilaterally, compatible with chronic microvascular ischemic disease. No evidence of large-territorial acute infarction. Grossly stable 2.8 x 2.1 cm intraparenchymal hematoma centered at the parasagittal posterior left frontal region. Redemonstration of intraventricular extension of the hemorrhage. Associated mild vasogenic edema. Stable 2 mm left-to-right midline shift. No hydrocephalus. Basilar cisterns are patent. Vascular: No hyperdense vessel. Atherosclerotic calcifications are present within the cavernous internal carotid and vertebral arteries. Skull: No acute fracture or focal lesion. Sinuses/Orbits: Left maxillary sinus mucosal thickening. Otherwise paranasal sinuses and mastoid air cells are clear. Bilateral lens replacement. Otherwise the orbits are unremarkable. Other: Persistent up to 9 mm right scalp hematoma. IMPRESSION: Grossly stable 2.8 by 2.1 cm intraparenchymal posterior left frontal lobe hemorrhage with intraventricular extension.  Stable 2 mm left-to-right midline shift. Electronically Signed   By: Tish Frederickson M.D.   On: 06/11/2022 18:35   DG Abd 1 View  Result Date: 06/11/2022 CLINICAL DATA:  101717 Nausea 101717 EXAM: ABDOMEN - 1 VIEW.  Right flank collimated off view. COMPARISON:  CT abdomen pelvis 09/12/2006 FINDINGS: The bowel gas pattern is normal. Right upper quadrant surgical clips. No radio-opaque calculi or other significant radiographic abnormality are seen. Multilevel degenerative changes of the spine. 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 IMPRESSION: 1. Nonobstructive bowel gas pattern. 2. Right flank collimated off view. Electronically Signed   By:  Tish Frederickson M.D.   On: 06/11/2022 18:31    Scheduled Meds:  atorvastatin  10 mg Oral Daily   Chlorhexidine Gluconate Cloth  6 each Topical Daily   divalproex  125 mg Oral BID   donepezil  10 mg Oral Daily   fesoterodine  4 mg Oral Daily   finasteride  5 mg Oral Daily   modafinil  100 mg Oral Daily   neomycin-bacitracin-polymyxin   Topical Daily   mouth rinse  15 mL Mouth Rinse 4 times per day   pantoprazole  40 mg Oral BID   ramipril  5 mg Oral Daily   sertraline  50 mg Oral Daily   tamsulosin  0.4 mg Oral Daily   Continuous Infusions:  promethazine (PHENERGAN) injection (IM or IVPB) Stopped (06/11/22 1856)   PRN Meds: acetaminophen, docusate sodium, LORazepam, ondansetron (ZOFRAN) IV, mouth rinse, polyethylene glycol, promethazine (PHENERGAN) injection (IM or IVPB)  Time spent: 50 minutes  Author: Gillis Santa. MD [facility] 06/12/2022 2:17 PM  To reach On-call, see care teams to locate the attending and reach out to them via [email or web address]. If 7PM-7AM, please contact night-coverage If you still have difficulty reaching the attending provider, please page the [facility] (Director on Call) for [facility] on amion for assistance.

## 2022-06-12 NOTE — Evaluation (Signed)
Physical Therapy Evaluation Patient Details Name: Philip Carpenter. MRN: [id number] DOB: [birth year] Today's Date: 06/12/2022  History of Present Illness  Pt is a 77 y/o M admitted on 06/10/22 after presenting with c/o falling backwards & hitting head. Head CT showed intraparenchymal hematoma within the periventricular white matter of the left parietoccipital region measuring 16 x 10 x 12 mm with mild adjacent edema. Neurosurgery was consulted & recommended admission to ICU for ongoing monitoring & repeat CT head. PMH: asthma, DM2, HLD, HTN, obesity, CVA  Clinical Impression  Pt seen for PT evaluation with co-tx with OT for pt & therapists's safety. Pt's wife Gigi Gin) present for session, reporting prior to admission pt was ambulatory (furniture walked or used a walking stick) but required significant cognitive assistance. On this date, pt responds to name but easily irritated by therapists' presence. Pt requires +2 to transfer supine<>sit & demonstrates R lateral lean with poor awareness/ability to correct while sitting EOB. Pt repeatedly states "leave me alone" so pt assisted back to bed. Pt weaned from 1L/min supplemental O2 to room air with SpO2 >90%. Continue to recommend ongoing PT services to progress activity tolerance, strengthening, transfers & gait as able.     Recommendations for follow up therapy are one component of a multi-disciplinary discharge planning process, led by the attending physician.  Recommendations may be updated based on patient status, additional functional criteria and insurance authorization.  Follow Up Recommendations Can patient physically be transported by private vehicle: No     Assistance Recommended at Discharge Frequent or constant Supervision/Assistance  Patient can return home with the following  Two people to help with walking and/or transfers;Direct supervision/assist for medications management;Two people to help with bathing/dressing/bathroom;Help with  stairs or ramp for entrance;Assist for transportation;Assistance with feeding;Assistance with cooking/housework;Direct supervision/assist for financial management    Equipment Recommendations None recommended by PT (TBD in next venue)  Recommendations for Other Services       Functional Status Assessment Patient has had a recent decline in their functional status and demonstrates the ability to make significant improvements in function in a reasonable and predictable amount of time.     Precautions / Restrictions Precautions Precautions: Fall Restrictions Weight Bearing Restrictions: No      Mobility  Bed Mobility Overal bed mobility: Needs Assistance Bed Mobility: Supine to Sit, Sit to Supine     Supine to sit: Max assist, +2 for physical assistance, HOB elevated Sit to supine: Max assist, +2 for safety/equipment        Transfers                        Ambulation/Gait                  Stairs            Wheelchair Mobility    Modified Rankin (Stroke Patients Only)       Balance Overall balance assessment: Needs assistance Sitting-balance support: Feet supported Sitting balance-Leahy Scale: Poor   Postural control: Right lateral lean                                   Pertinent Vitals/Pain Pain Assessment Pain Assessment: Faces Faces Pain Scale: Hurts a little bit Pain Location: pt states "you're hurting me!" but doesn't state where Pain Intervention(s): Repositioned, Monitored during session    Home Living Family/patient expects to be discharged to:: Private  residence Living Arrangements: Spouse/significant other;Children Available Help at Discharge: Family;Available 24 hours/day Type of Home: House Home Access: Stairs to enter Entrance Stairs-Rails:  (1 rail, unsure which side) Entrance Stairs-Number of Steps: 3       Additional Comments: walking stick    Prior Function               Mobility Comments:  Wife reports pt is ambulatory via furniture walking in the home or holding to a walking stick.       Hand Dominance        Extremity/Trunk Assessment   Upper Extremity Assessment Upper Extremity Assessment: Difficult to assess due to impaired cognition    Lower Extremity Assessment Lower Extremity Assessment: Difficult to assess due to impaired cognition    Cervical / Trunk Assessment Cervical / Trunk Assessment:  (Pt with R flexed postured (cervical & thoracic spine))  Communication      Cognition Arousal/Alertness: Awake/alert Behavior During Therapy: Agitated Overall Cognitive Status: History of cognitive impairments - at baseline                                 General Comments: Pt responds to name but easily irritated by therapy presence, repeatedly states "leave me alone!"        General Comments      Exercises     Assessment/Plan    PT Assessment Patient needs continued PT services  PT Problem List Decreased strength;Decreased range of motion;Decreased activity tolerance;Decreased balance;Decreased mobility;Decreased safety awareness;Decreased knowledge of use of DME       PT Treatment Interventions DME instruction;Therapeutic exercise;Gait training;Balance training;Stair training;Neuromuscular re-education;Functional mobility training;Therapeutic activities;Patient/family education;Modalities;Manual techniques    PT Goals (Current goals can be found in the Care Plan section)  Acute Rehab PT Goals Patient Stated Goal: get better PT Goal Formulation: With family Time For Goal Achievement: 06/26/22 Potential to Achieve Goals: Fair    Frequency Min 3X/week     Co-evaluation PT/OT/SLP Co-Evaluation/Treatment: Yes Reason for Co-Treatment: Complexity of the patient's impairments (multi-system involvement);Necessary to address cognition/behavior during functional activity;For patient/therapist safety PT goals addressed during session:  Mobility/safety with mobility         AM-PAC PT "6 Clicks" Mobility  Outcome Measure Help needed turning from your back to your side while in a flat bed without using bedrails?: Total Help needed moving from lying on your back to sitting on the side of a flat bed without using bedrails?: Total Help needed moving to and from a bed to a chair (including a wheelchair)?: Total Help needed standing up from a chair using your arms (e.g., wheelchair or bedside chair)?: Total Help needed to walk in hospital room?: Total Help needed climbing 3-5 steps with a railing? : Total 6 Click Score: 6    End of Session   Activity Tolerance: Treatment limited secondary to agitation Patient left: in bed;with family/visitor present Nurse Communication: Mobility status (pt now on room air) PT Visit Diagnosis: Difficulty in walking, not elsewhere classified (R26.2);Other abnormalities of gait and mobility (R26.89);Unsteadiness on feet (R26.81)    Time: 2831-5176 PT Time Calculation (min) (ACUTE ONLY): 12 min   Charges:   PT Evaluation $PT Eval High Complexity: 1 High          Aleda Grana, PT, DPT 06/12/22, 11:07 AM   Sandi Mariscal 06/12/2022, 11:04 AM

## 2022-06-13 ENCOUNTER — Other Ambulatory Visit: Payer: Self-pay | Admitting: Urology

## 2022-06-13 DIAGNOSIS — Z515 Encounter for palliative care: Secondary | ICD-10-CM

## 2022-06-13 DIAGNOSIS — Z66 Do not resuscitate: Secondary | ICD-10-CM

## 2022-06-13 DIAGNOSIS — I629 Nontraumatic intracranial hemorrhage, unspecified: Secondary | ICD-10-CM | POA: Diagnosis not present

## 2022-06-13 DIAGNOSIS — S06310A Contusion and laceration of right cerebrum without loss of consciousness, initial encounter: Secondary | ICD-10-CM | POA: Diagnosis not present

## 2022-06-13 LAB — CBC
HCT: 34.2 % — ABNORMAL LOW (ref 39.0–52.0)
Hemoglobin: 11 g/dL — ABNORMAL LOW (ref 13.0–17.0)
MCH: 32.6 pg (ref 26.0–34.0)
MCHC: 32.2 g/dL (ref 30.0–36.0)
MCV: 101.5 fL — ABNORMAL HIGH (ref 80.0–100.0)
Platelets: 162 10*3/uL (ref 150–400)
RBC: 3.37 MIL/uL — ABNORMAL LOW (ref 4.22–5.81)
RDW: 14.6 % (ref 11.5–15.5)
WBC: 8.7 10*3/uL (ref 4.0–10.5)
nRBC: 0 % (ref 0.0–0.2)

## 2022-06-13 LAB — MAGNESIUM: Magnesium: 1.7 mg/dL (ref 1.7–2.4)

## 2022-06-13 LAB — BASIC METABOLIC PANEL
Anion gap: 8 (ref 5–15)
BUN: 15 mg/dL (ref 8–23)
CO2: 30 mmol/L (ref 22–32)
Calcium: 8.1 mg/dL — ABNORMAL LOW (ref 8.9–10.3)
Chloride: 104 mmol/L (ref 98–111)
Creatinine, Ser: 0.58 mg/dL — ABNORMAL LOW (ref 0.61–1.24)
GFR, Estimated: >60 mL/min (ref >60)
Glucose, Bld: 171 mg/dL — ABNORMAL HIGH (ref 70–99)
Potassium: 3.5 mmol/L (ref 3.5–5.1)
Sodium: 142 mmol/L (ref 135–145)

## 2022-06-13 LAB — GLUCOSE, CAPILLARY
Glucose-Capillary: 147 mg/dL — ABNORMAL HIGH (ref 70–99)
Glucose-Capillary: 150 mg/dL — ABNORMAL HIGH (ref 70–99)
Glucose-Capillary: 173 mg/dL — ABNORMAL HIGH (ref 70–99)
Glucose-Capillary: 217 mg/dL — ABNORMAL HIGH (ref 70–99)
Glucose-Capillary: 217 mg/dL — ABNORMAL HIGH (ref 70–99)
Glucose-Capillary: 225 mg/dL — ABNORMAL HIGH (ref 70–99)
Glucose-Capillary: 227 mg/dL — ABNORMAL HIGH (ref 70–99)

## 2022-06-13 LAB — PHOSPHORUS: Phosphorus: 2.9 mg/dL (ref 2.5–4.6)

## 2022-06-13 MED ORDER — CYANOCOBALAMIN 1000 MCG/ML IJ SOLN
1000.0000 ug | Freq: Every day | INTRAMUSCULAR | Status: DC
  Administered 2022-06-14 – 2022-06-16 (×3): 1000 ug via INTRAMUSCULAR
  Filled 2022-06-13 (×4): qty 1

## 2022-06-13 MED ORDER — INSULIN ASPART 100 UNIT/ML IJ SOLN: 0.0000 [IU] | Freq: Three times a day (TID) | INTRAMUSCULAR | Status: DC

## 2022-06-13 MED ORDER — VITAMIN D (ERGOCALCIFEROL) 1.25 MG (50000 UNIT) PO CAPS
50000.0000 [IU] | ORAL_CAPSULE | ORAL | Status: DC
  Filled 2022-06-13: qty 1

## 2022-06-13 MED ORDER — INSULIN ASPART 100 UNIT/ML IJ SOLN
0.0000 [IU] | Freq: Every day | INTRAMUSCULAR | Status: DC
  Administered 2022-06-13: 2 [IU] via SUBCUTANEOUS
  Filled 2022-06-13: qty 1

## 2022-06-13 MED ORDER — ENOXAPARIN SODIUM 40 MG/0.4ML IJ SOSY
40.0000 mg | PREFILLED_SYRINGE | Freq: Every evening | INTRAMUSCULAR | Status: DC
  Administered 2022-06-14 – 2022-06-15 (×2): 40 mg via SUBCUTANEOUS
  Filled 2022-06-13 (×3): qty 0.4

## 2022-06-13 MED ORDER — VITAMIN B-12 1000 MCG PO TABS: 1000.0000 ug | ORAL_TABLET | Freq: Every day | ORAL | Status: DC

## 2022-06-13 NOTE — TOC Progression Note (Signed)
Transition of Care (TOC) - Progression Note    Patient Details  Name: Philip Carpenter. MRN: [id number] Date of Birth: 04/06/1945  Transition of Care [facility]) CM/SW Contact  Darolyn Rua, [place] Phone Number: 06/13/2022, 11:24 AM  Clinical Narrative:     No bed offers at this time, CSW has sent more referrals and expanded search. Pending bed offers at this time.     Barriers to Discharge: Continued Medical Work up  Expected Discharge Plan and Services     Post Acute Care Choice:  (TBD) Living arrangements for the past 2 months: Single Family Home                                       Social Determinants of Health (SDOH) Interventions SDOH Screenings   Food Insecurity: No Food Insecurity (03/30/2022)  Housing: Low Risk  (03/30/2022)  Transportation Needs: No Transportation Needs (03/30/2022)  Utilities: Not At Risk (03/30/2022)  Tobacco Use: Low Risk  (06/10/2022)    Readmission Risk Interventions     No data to display

## 2022-06-13 NOTE — Progress Notes (Signed)
[facility] Progress Note  Patient: Philip Carpenter.    QIH:[id number]  DOA: 06/10/2022     Date of Service: the patient was seen and examined on 06/13/2022  Chief Complaint  Patient presents with   Fall   Brief hospital course: This is a 77 yo male with PMH of Asthma, Type II Diabetes Mellitus, HLD HTN, Melanoma of Scalp, Obesity, CVA,  who presented to [facility] ER on 04/13 following a mechanical fall. Per ER notes pts wife reported the pt fell backwards and hit the back of his head while getting out of a truck. He does not take anticoagulation, but does take aspirin 81 mg daily.   Upon arrival to the ER pt noted to have a moderate size hematoma on the right occipital scalp. He also had a laceration on the dorsal aspect of his right hand requiring 1-2 sutures. CT Head revealed an acute intraparenchymal hematoma within the periventricular white matter of the left parietoccipital region measuring 16 x 10 x 12 mm with mild adjacent edema. ER provider consulted Neurosurgeon Dr. Adriana Simas who recommended admission to ICU for ongoing monitoring, and repeating CT Head in 6hrs. PCCM team contacted for ICU admission.   CT Head/Cervical Spine: Acute intraparenchymal hematoma within the periventricular white matter of the left parietooccipital region measuring 16 x 10 x 12 mm with mild adjacent edema. No significant mass effect or midline shift. Large scalp hematoma overlying the right parietal and occipital regions. No underlying adrenal fracture. No acute fracture or traumatic listhesis of the cervical spine.   Assessment and Plan:  #Acute intraparenchymal hematoma secondary to mechanical fall  #HTN and Hx: CVA and Dementia  Continuous telemetry monitoring  S/p NIH stroke scale q1hr, changed to neurocheck every 4 hourly  S/p Nicardipine gtt, d/c'd on 4/15, keep MAP <110 as per neurosurgery - Stat CT Head for acute neurological changes  - Neurosurgery consulted appreciate input: no indication for  surgical intervention at this time.  Hold aspirin for 7 days, follow-up in 2 to 3 weeks to repeat CT head as an outpatient - Trend CBC  - Fall precautions  - PT/OT eval done, TOC is following for SNF placement   #Asthma~stable  - Supplemental O2 for dyspnea and/or hypoxia  - Prn bronchodilator therapy    #Hypomagnesia, Mag repleted. #Hypophosphatemia, Phos repleted. - Replace electrolytes as indicated    #Nausea/vomiting  - Prn zofran  - Continue PPI    #Type II diabetes mellitus  - CBG's q4hrs  - Follow hyper/hypoglycemic protocol   Dementia, depression and anxiety Continued home meds Aricept, Zoloft, modafinil,  Ativan as needed for anxiety   BPH, continued Flomax, Proscar and Toviaz Vitamin D level 28, slightly low, started vitamin D supplement. Vitamin B12 level 315, goal> 400, started vitamin B12 IM injection daily during hospital stay followed by oral supplement.   Body mass index is 28.94 kg/m.  Interventions:    Diet: Heart healthy diet DVT Prophylaxis: Subcutaneous Lovenox   Advance goals of care discussion: DNR  Family Communication: family was present at bedside, at the time of interview.  The pt provided permission to discuss medical plan with the family. Opportunity was given to ask question and all questions were answered satisfactorily.   Disposition:  Pt is from Home, admitted with Fall and intraparenchymal hemorrhage, still has risk of fall, PT/OT eval done, recommend SNF placement.   Discharge to SNF when bed will be available.  Likely stable and medically optimized to discharge   Subjective: No  significant events overnight.  Patient was awake and alert, AAO x 1, lying comfortably in the bed without any acute distress, denies any complaints.    Physical Exam: General: NAD, lying comfortably Appear in no distress, affect appropriate Eyes: PERRLA, Right later eye bruise notice on 4/16 ENT: Oral Mucosa Clear, moist, right parietal and occipital  hematoma and bruise Neck: no JVD,  Cardiovascular: S1 and S2 Present, no Murmur,  Respiratory: good respiratory effort, Bilateral Air entry equal and Decreased, no Crackles, no wheezes Abdomen: Bowel Sound present, Soft and no tenderness,  Skin: no rashes Extremities: no Pedal edema, no calf tenderness Neurologic: without any new focal findings Gait not checked due to patient safety concerns  Vitals:   06/13/22 0600 06/13/22 0701 06/13/22 1200 06/13/22 1300  BP: (!) 129/54  (!) 124/58 (!) 113/46  Pulse: 78  (!) 55 (!) 53  Resp: (!) 21  10 15   Temp:  98.5 F (36.9 C) 98.8 F (37.1 C)   TempSrc:  Axillary Axillary   SpO2: 90%  98% 98%  Weight:      Height:        Intake/Output Summary (Last 24 hours) at 06/13/2022 1332 Last data filed at 06/13/2022 0900 Gross per 24 hour  Intake 359.76 ml  Output 650 ml  Net -290.24 ml   Filed Weights   06/11/22 0313 06/12/22 0200 06/13/22 0500  Weight: 90.8 kg 91.5 kg 89.4 kg    Data Reviewed: I have personally reviewed and interpreted daily labs, tele strips, imagings as discussed above. I reviewed all nursing notes, pharmacy notes, vitals, pertinent old records I have discussed plan of care as described above with RN and patient/family.  CBC: Recent Labs  Lab 06/10/22 1441 06/11/22 0405 06/12/22 0547 06/13/22 0459  WBC 14.0* 11.3* 9.4 8.7  NEUTROABS  --   --  6.4  --   HGB 13.1 12.2* 12.0* 11.0*  HCT 40.2 37.3* 37.4* 34.2*  MCV 100.0 99.5 101.1* 101.5*  PLT 195 186 169 162   Basic Metabolic Panel: Recent Labs  Lab 06/10/22 1441 06/11/22 0405 06/12/22 0547 06/13/22 0459  NA 139 140 141 142  K 4.0 3.5 3.7 3.5  CL 106 105 105 104  CO2 24 29 28 30   GLUCOSE 69* 127* 164* 171*  BUN 15 18 13 15   CREATININE 0.64 0.58* 0.52* 0.58*  CALCIUM 8.3* 8.4* 8.4* 8.1*  MG  --  1.7 2.0 1.7  PHOS  --  3.5 2.4* 2.9    Studies: No results found.  Scheduled Meds:  atorvastatin  10 mg Oral Daily   Chlorhexidine Gluconate Cloth  6  each Topical Daily   cyanocobalamin  1,000 mcg Intramuscular Daily   Followed by   Melene Muller ON 06/20/2022] vitamin B-12  1,000 mcg Oral Daily   divalproex  125 mg Oral BID   donepezil  10 mg Oral Daily   fesoterodine  4 mg Oral Daily   finasteride  5 mg Oral Daily   modafinil  100 mg Oral Daily   neomycin-bacitracin-polymyxin   Topical Daily   mouth rinse  15 mL Mouth Rinse 4 times per day   pantoprazole  40 mg Oral BID   ramipril  5 mg Oral Daily   sertraline  50 mg Oral Daily   tamsulosin  0.4 mg Oral Daily   Vitamin D (Ergocalciferol)  50,000 Units Oral Q7 days   Continuous Infusions:  promethazine (PHENERGAN) injection (IM or IVPB) Stopped (06/11/22 1856)   PRN Meds: acetaminophen, docusate  sodium, LORazepam, ondansetron (ZOFRAN) IV, mouth rinse, polyethylene glycol, promethazine (PHENERGAN) injection (IM or IVPB)  Time spent: 50 minutes  Author: Gillis Santa. MD [facility] 06/13/2022 1:32 PM  To reach On-call, see care teams to locate the attending and reach out to them via [email or web address]. If 7PM-7AM, please contact night-coverage If you still have difficulty reaching the attending provider, please page the [facility] (Director on Call) for [facility] on amion for assistance.

## 2022-06-13 NOTE — Consult Note (Signed)
Consultation Note Date: 06/13/2022 at 1015  Patient Name: Philip Carpenter.  DOB: 1945-12-06  MRN: [id number]  Age / Sex: 77 y.o., male  PCP: Marguarite Arbour, MD Referring Physician: Gillis Santa, MD  Reason for Consultation: Establishing goals of care  HPI/Patient Profile: 77 y.o. male  with past medical history of type 2 diabetes, HLD, asthma, HTN, melanoma of scalp, dementia, depression, anxiety, BPH, obesity, and CVA (2014) and admitted on 06/10/2022 from home with moderate-sized hematoma on right occipital scalp cyst s/p mechanical ground-level fall at home.  CT of the head revealed an acute intraparenchymal hematoma within the periventricular white matter of the left paroetoccipital region (16 X10 X 12 mm) with mild adjacent edema.  No underlying adrenal fractures, acute fractures, or traumatic listhesis of the cervical spine were noted.  Neurosurgery was consulted and no indication for surgical intervention at this time.  Repeat CT of head as an outpatient has been recommended in 2 to 3 weeks.  PMT was consulted to discuss goals of care.  Clinical Assessment and Goals of Care: I have reviewed medical records including EPIC notes, labs and imaging, assessed the patient and then met with patient (unable to participate in medical decision making/GOC discussions independently), wife Gigi Gin, and daughter Bonita Quin at bedside to discuss diagnosis prognosis, GOC, EOL wishes, disposition and options.  I introduced Palliative Medicine as specialized medical care for people living with serious illness. It focuses on providing relief from the symptoms and stress of a serious illness. The goal is to improve quality of life for both the patient and the family.  We discussed a brief life review of the patient.  Patient and wife have been married for 55 years.  They have 1 daughter.  Patient worked as a Medical illustrator for a  [employer]  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  She shares he has had a "good life" and has been able to travel the world.  As far as functional and nutritional status family endorses patient was shuffling and using a walking stick at home.  They shared they offered his favorite food but that he would only take bites every now and then.  Gigi Gin states she has seen a significant decline in patient's functional, nutritional, and cognitive status since September of last year.  She believes he is lost approximately 25 pounds due to poor appetite and poor p.o. intake.  We discussed patient's current illness and what it means in the larger context of patient's on-going co-morbidities.  Natural disease trajectory discussed.  I attempted to elicit values and goals of care important to the patient.    The difference between aggressive medical intervention and comfort care was considered in light of the patient's goals of care.   Advance directives, concepts specific to code status, artificial feeding and hydration, and rehospitalization were considered and discussed.  Education offered regarding concept specific to human mortality and the  limitations of medical interventions to prolong life when the body begins to fail to thrive.  We discussed of dementia as a progressive, chronic, and irreversible disease.    Peggy and Bonita Quin shared they know that the patient will not likely improve.  They are hopeful that his strength could be somewhat improved after this hospitalization in order for him to be able to return home with them.  They share they are sad about patient's current medical status but have tried to prepare themselves given his dementia diagnosis.  I highlighted patient's advance directive (created in 1993).  Peggy and Bonita Quin are in agreement that patient would never want aggressive  measures, would want to be a DNR, and would want to return home to age in place if possible.    Family is facing treatment option decisions and anticipatory care needs.  Gigi Gin shares she believes that she and her family are prepared to discuss hospice.  Hospice philosophy and hospice options after discharge reviewed.  Hard choices for loving people given for family to review.   Discussed with patient/family the importance of continued conversation with family and the medical providers regarding overall plan of care and treatment options, ensuring decisions are within the context of the patient's values and GOCs.    Questions and concerns were addressed. The family was encouraged to call with questions or concerns.  Family would like to know if rehab is an option for patient.  TOC following closely.  PMT will continue to follow and support patient and family throughout his hospitalization.  Primary Decision Maker NEXT OF KIN  Physical Exam Vitals reviewed.  Constitutional:      General: He is not in acute distress.    Appearance: He is not ill-appearing.  HENT:     Head: Normocephalic.     Mouth/Throat:     Mouth: Mucous membranes are moist.  Cardiovascular:     Rate and Rhythm: Normal rate.     Pulses: Normal pulses.  Pulmonary:     Effort: Pulmonary effort is normal.  Abdominal:     Palpations: Abdomen is soft.  Skin:    General: Skin is warm and dry.     Coloration: Skin is pale.  Neurological:     Comments: nonverbal     Palliative Assessment/Data: 30%     Thank you for this consult. Palliative medicine will continue to follow and assist holistically.   Time Total: 75 minutes Greater than 50%  of this time was spent counseling and coordinating care related to the above assessment and plan.  Signed by: Georgiann Cocker, DNP, FNP-BC Palliative Medicine    Please contact Palliative Medicine Team phone at [phone] for questions and concerns.  For individual  provider: See Loretha Stapler

## 2022-06-13 NOTE — Progress Notes (Signed)
Physical Therapy Treatment Patient Details Name: Philip Carpenter. MRN: [id number] DOB: Oct 12, 1945 Today's Date: 06/13/2022   History of Present Illness Pt is a 77 y/o M admitted on 06/10/22 after presenting with c/o falling backwards & hitting head. Head CT showed intraparenchymal hematoma within the periventricular white matter of the left parietoccipital region measuring 16 x 10 x 12 mm with mild adjacent edema. Neurosurgery was consulted & recommended admission to ICU for ongoing monitoring & repeat CT head. PMH: asthma, DM2, HLD, HTN, obesity, CVA    PT Comments    Pt was long sitting in bed with BUE mitts in place. He is alert but only oriented to self. PT session greatly impacted by pt's agitation and willingness to participate. He required +2 assistance to safely achieve EOB sitting. [name] states several times during session " Leave me alone." BP in supine 132/56 (76), unable to get accurate reading in sitting due to pt's cognition/ unwillingness. He has severe sitting balance deficits with heavy R lateral posterior LOB. Requires constant assistance to maintain balance while sitting EOB.Pt was unwilling to even with +2 max assist + encouragement . Acute PT will continue to follow and progress as able per current POC. He will greatly benefit from continued skilled PT to maximize his independence and safety with all ADLs   Recommendations for follow up therapy are one component of a multi-disciplinary discharge planning process, led by the attending physician.  Recommendations may be updated based on patient status, additional functional criteria and insurance authorization.  Follow Up Recommendations  Can patient physically be transported by private vehicle: No    Assistance Recommended at Discharge Frequent or constant Supervision/Assistance  Patient can return home with the following Two people to help with walking and/or transfers;Direct supervision/assist for medications  management;Two people to help with bathing/dressing/bathroom;Help with stairs or ramp for entrance;Assist for transportation;Assistance with feeding;Assistance with cooking/housework;Direct supervision/assist for financial management   Equipment Recommendations   (defer to next level of care)       Precautions / Restrictions Precautions Precautions: Fall Restrictions Weight Bearing Restrictions: No     Mobility  Bed Mobility Overal bed mobility: Needs Assistance Bed Mobility: Supine to Sit, Sit to Supine  Supine to sit: Max assist, +2 for physical assistance, HOB elevated Sit to supine: +2 for physical assistance, Max assist General bed mobility comments: Pt's cognition greatly impacts session progression. Author feels will do better with spouse/ friendsly faces present    Transfers  General transfer comment: pt too resistive. unwilling to attempt standing even with +2 assistance. Author feels pt will be able to perform when less agitated     Balance Overall balance assessment: Needs assistance Sitting-balance support: Feet supported Sitting balance-Leahy Scale: Poor Sitting balance - Comments: pt has severe R lateral LOB in sitting. Did not attempt to maintain balance on his own. Constant assistance to prevent falling in even sitting      Cognition Arousal/Alertness: Awake/alert Behavior During Therapy: Agitated Overall Cognitive Status: History of cognitive impairments - at baseline        General Comments: pt is alert but confused. Gets agitated with attempts to get OOB               Pertinent Vitals/Pain Pain Assessment Pain Assessment: No/denies pain Faces Pain Scale: No hurt Pain Intervention(s): Limited activity within patient's tolerance, Monitored during session, Premedicated before session, Repositioned     PT Goals (current goals can now be found in the care plan section) Acute Rehab  PT Goals Patient Stated Goal: Be left alone Progress towards PT  goals: Not progressing toward goals - comment    Frequency    Min 3X/week      PT Plan Current plan remains appropriate    Co-evaluation     PT goals addressed during session: Mobility/safety with mobility;Balance        AM-PAC PT "6 Clicks" Mobility   Outcome Measure  Help needed turning from your back to your side while in a flat bed without using bedrails?: Total Help needed moving from lying on your back to sitting on the side of a flat bed without using bedrails?: Total Help needed moving to and from a bed to a chair (including a wheelchair)?: Total Help needed standing up from a chair using your arms (e.g., wheelchair or bedside chair)?: Total Help needed to walk in hospital room?: Total Help needed climbing 3-5 steps with a railing? : Total 6 Click Score: 6    End of Session Equipment Utilized During Treatment: Oxygen Activity Tolerance: Other (comment) (severely limited by pt's willingess to participate. Pt gets agitated with attempts to mobilize) Patient left: in bed;with call bell/phone within reach;with bed alarm set;with nursing/sitter in room;Other (comment) (RN tech in room at conclusion of PT session) Nurse Communication: Mobility status PT Visit Diagnosis: Difficulty in walking, not elsewhere classified (R26.2);Other abnormalities of gait and mobility (R26.89);Unsteadiness on feet (R26.81)     Time: 9147-8295 PT Time Calculation (min) (ACUTE ONLY): 20 min  Charges:  $Therapeutic Activity: 8-22 mins                    Jetta Lout PTA 06/13/22, 9:50 AM

## 2022-06-14 DIAGNOSIS — S06310A Contusion and laceration of right cerebrum without loss of consciousness, initial encounter: Secondary | ICD-10-CM | POA: Diagnosis not present

## 2022-06-14 DIAGNOSIS — Z515 Encounter for palliative care: Secondary | ICD-10-CM | POA: Diagnosis not present

## 2022-06-14 DIAGNOSIS — Z66 Do not resuscitate: Secondary | ICD-10-CM | POA: Diagnosis not present

## 2022-06-14 DIAGNOSIS — I629 Nontraumatic intracranial hemorrhage, unspecified: Secondary | ICD-10-CM | POA: Diagnosis not present

## 2022-06-14 LAB — BASIC METABOLIC PANEL
Anion gap: 8 (ref 5–15)
BUN: 18 mg/dL (ref 8–23)
CO2: 28 mmol/L (ref 22–32)
Calcium: 8.5 mg/dL — ABNORMAL LOW (ref 8.9–10.3)
Chloride: 103 mmol/L (ref 98–111)
Creatinine, Ser: 0.58 mg/dL — ABNORMAL LOW (ref 0.61–1.24)
GFR, Estimated: >60 mL/min (ref >60)
Glucose, Bld: 207 mg/dL — ABNORMAL HIGH (ref 70–99)
Potassium: 3.6 mmol/L (ref 3.5–5.1)
Sodium: 139 mmol/L (ref 135–145)

## 2022-06-14 LAB — GLUCOSE, CAPILLARY
Glucose-Capillary: 192 mg/dL — ABNORMAL HIGH (ref 70–99)
Glucose-Capillary: 181 mg/dL — ABNORMAL HIGH (ref 70–99)
Glucose-Capillary: 270 mg/dL — ABNORMAL HIGH (ref 70–99)
Glucose-Capillary: 239 mg/dL — ABNORMAL HIGH (ref 70–99)
Glucose-Capillary: 236 mg/dL — ABNORMAL HIGH (ref 70–99)

## 2022-06-14 LAB — CBC
HCT: 35 % — ABNORMAL LOW (ref 39.0–52.0)
Hemoglobin: 11.4 g/dL — ABNORMAL LOW (ref 13.0–17.0)
MCH: 32.6 pg (ref 26.0–34.0)
MCHC: 32.6 g/dL (ref 30.0–36.0)
MCV: 100 fL (ref 80.0–100.0)
Platelets: 183 10*3/uL (ref 150–400)
RBC: 3.5 MIL/uL — ABNORMAL LOW (ref 4.22–5.81)
RDW: 14.4 % (ref 11.5–15.5)
WBC: 7.9 10*3/uL (ref 4.0–10.5)
nRBC: 0 % (ref 0.0–0.2)

## 2022-06-14 LAB — MAGNESIUM: Magnesium: 1.8 mg/dL (ref 1.7–2.4)

## 2022-06-14 LAB — PHOSPHORUS: Phosphorus: 2.6 mg/dL (ref 2.5–4.6)

## 2022-06-14 MED ORDER — AMLODIPINE BESYLATE 10 MG PO TABS
10.0000 mg | ORAL_TABLET | Freq: Every day | ORAL | Status: DC
  Administered 2022-06-14 – 2022-06-15 (×2): 10 mg via ORAL
  Filled 2022-06-14 (×4): qty 1

## 2022-06-14 MED ORDER — INSULIN ASPART 100 UNIT/ML IJ SOLN
0.0000 [IU] | INTRAMUSCULAR | Status: DC
  Administered 2022-06-14: 3 [IU] via SUBCUTANEOUS
  Administered 2022-06-14 – 2022-06-15 (×3): 2 [IU] via SUBCUTANEOUS
  Administered 2022-06-15: 3 [IU] via SUBCUTANEOUS
  Administered 2022-06-15 (×2): 2 [IU] via SUBCUTANEOUS
  Administered 2022-06-15: 3 [IU] via SUBCUTANEOUS
  Administered 2022-06-16 (×2): 2 [IU] via SUBCUTANEOUS
  Administered 2022-06-16: 1 [IU] via SUBCUTANEOUS
  Administered 2022-06-16: 2 [IU] via SUBCUTANEOUS
  Administered 2022-06-16: 3 [IU] via SUBCUTANEOUS
  Filled 2022-06-14 (×12): qty 1

## 2022-06-14 MED ORDER — RAMIPRIL 5 MG PO CAPS
10.0000 mg | ORAL_CAPSULE | Freq: Every day | ORAL | Status: DC
  Administered 2022-06-14 – 2022-06-16 (×3): 10 mg via ORAL
  Filled 2022-06-14: qty 2
  Filled 2022-06-14: qty 1
  Filled 2022-06-14 (×2): qty 2

## 2022-06-14 NOTE — Progress Notes (Signed)
Physical Therapy Treatment Patient Details Name: Philip Carpenter. MRN: [id number] DOB: 1945-11-22 Today's Date: 06/14/2022   History of Present Illness Pt is a 77 y/o M admitted on 06/10/22 after presenting with c/o falling backwards & hitting head. Head CT showed intraparenchymal hematoma within the periventricular white matter of the left parietoccipital region measuring 16 x 10 x 12 mm with mild adjacent edema. Neurosurgery was consulted & recommended admission to ICU for ongoing monitoring & repeat CT head. PMH: asthma, DM2, HLD, HTN, obesity, CVA    PT Comments    Pt was just finishing being changed after have BM in bed. He is alert but only oriented to self. Pt 's spouse was present throughout session and states, "His cognition is close to what it was before."   Pt was agreeable however throughout session gets slightly agitated and is resistive to performing any/all desired task. He required +2 assistance to achieve EOB sitting. Sat EOB x ~ 8 minutes but has severe R lateral lean and is unable to maintain balance at EOB. Pt has flexed posture. Is able to correct but quickly reverts to slumped posture. "Leave me alone." Total assisted back into bed. Pt vomits once repositioned. RN made aware. Acute PT will continue to follow and progress as able per current POC.    Recommendations for follow up therapy are one component of a multi-disciplinary discharge planning process, led by the attending physician.  Recommendations may be updated based on patient status, additional functional criteria and insurance authorization.     Assistance Recommended at Discharge Frequent or constant Supervision/Assistance  Patient can return home with the following Two people to help with walking and/or transfers;Direct supervision/assist for medications management;Two people to help with bathing/dressing/bathroom;Help with stairs or ramp for entrance;Assist for transportation;Assistance with feeding;Assistance with  cooking/housework;Direct supervision/assist for financial management   Equipment Recommendations  Other (comment) (Defer to next level of care)       Precautions / Restrictions Precautions Precautions: Fall Restrictions Weight Bearing Restrictions: No     Mobility  Bed Mobility Overal bed mobility: Needs Assistance Bed Mobility: Rolling, Supine to Sit, Sit to Supine Rolling: Supervision Supine to sit: Max assist, +2 for physical assistance Sit to supine: Total assist General bed mobility comments: When pt is cooperative or performing mobility that he wants to perform, was able to perform with supervision only. Pt however remains resistive to mobility and transfers. Max +2 to achieve EOB short sit. severe R lateral lean with constant assistance to prevent LOB    Transfers    General transfer comment: pt unable. Did attempt however due to pt's unwillingness was unsafe to advance. recommend hoyer transfers at this time until pt is able to demonstrate safe transfer abilities     Balance Overall balance assessment: Needs assistance Sitting-balance support: Feet supported, Bilateral upper extremity supported Sitting balance-Leahy Scale: Poor Sitting balance - Comments: continuous mod-max to prevent falling R       Cognition Arousal/Alertness: Awake/alert Behavior During Therapy: Flat affect, Agitated Overall Cognitive Status: History of cognitive impairments - at baseline      General Comments: pt remains only oriented to self. Per spouse, baseline cognition is about the same as current state however pt was agreeable/cooperative prior to admission. pt still gets agitated with encouragement to participate.               Pertinent Vitals/Pain Pain Assessment Pain Assessment: PAINAD Breathing: normal Negative Vocalization: occasional moan/groan, low speech, negative/disapproving quality Facial Expression: sad, frightened, frown  Body Language: tense, distressed pacing,  fidgeting Consolability: distracted or reassured by voice/touch PAINAD Score: 4 Pain Location:  ("all over.") Pain Descriptors / Indicators: Discomfort Pain Intervention(s): Limited activity within patient's tolerance, Monitored during session, Premedicated before session, Repositioned     PT Goals (current goals can now be found in the care plan section) Acute Rehab PT Goals Patient Stated Goal: leave me alone Progress towards PT goals: Progressing toward goals    Frequency    Min 3X/week      PT Plan Current plan remains appropriate       AM-PAC PT "6 Clicks" Mobility   Outcome Measure  Help needed turning from your back to your side while in a flat bed without using bedrails?: A Little Help needed moving from lying on your back to sitting on the side of a flat bed without using bedrails?: Total Help needed moving to and from a bed to a chair (including a wheelchair)?: Total Help needed standing up from a chair using your arms (e.g., wheelchair or bedside chair)?: Total Help needed to walk in hospital room?: Total Help needed climbing 3-5 steps with a railing? : Total 6 Click Score: 8    End of Session   Activity Tolerance: Treatment limited secondary to agitation Patient left: in bed;with call bell/phone within reach;with bed alarm set;Other (comment);with family/visitor present (RN informed of pt's nausea/vomiting) Nurse Communication: Mobility status PT Visit Diagnosis: Difficulty in walking, not elsewhere classified (R26.2);Other abnormalities of gait and mobility (R26.89);Unsteadiness on feet (R26.81)     Time: 1610-9604 PT Time Calculation (min) (ACUTE ONLY): 22 min  Charges:  $Therapeutic Activity: 8-22 mins                     Jetta Lout PTA 06/14/22, 2:52 PM

## 2022-06-14 NOTE — Progress Notes (Signed)
Inpatient Diabetes Program Recommendations  AACE/ADA: New Consensus Statement on Inpatient Glycemic Control (2015)  Target Ranges:  Prepandial:   less than 140 mg/dL      Peak postprandial:   less than 180 mg/dL (1-2 hours)      Critically ill patients:  140 - 180 mg/dL   Lab Results  Component Value Date   GLUCAP 270 (H) 06/14/2022   HGBA1C 5.6 03/30/2022    Review of Glycemic Control  Latest Reference Range & Units 06/13/22 07:14 06/13/22 11:16 06/13/22 15:41 06/13/22 19:31 06/13/22 21:03 06/14/22 04:44 06/14/22 08:01 06/14/22 11:32  Glucose-Capillary 70 - 99 mg/dL 657 (H) 846 (H) 962 (H) 217 (H) 227 (H) 192 (H) 181 (H) 270 (H)   Diabetes history: DM  Outpatient Diabetes medications:  Basaglar 20 units q HS Metformin 500 mg daily Current orders for Inpatient glycemic control:  Novolog 0-5 units q HS Inpatient Diabetes Program Recommendations:    Consider changing Novolog correction to sensitive q 4 hours.    Thanks,  Beryl Meager, RN, BC-ADM Inpatient Diabetes Coordinator Pager [phone]  (8a-5p)

## 2022-06-14 NOTE — Progress Notes (Signed)
Occupational Therapy Treatment Patient Details Name: Philip Carpenter. MRN: [id number] DOB: February 20, 1946 Today's Date: 06/14/2022   History of present illness Pt is a 77 y/o M admitted on 06/10/22 after presenting with c/o falling backwards & hitting head. Head CT showed intraparenchymal hematoma within the periventricular white matter of the left parietoccipital region measuring 16 x 10 x 12 mm with mild adjacent edema. Neurosurgery was consulted & recommended admission to ICU for ongoing monitoring & repeat CT head. PMH: asthma, DM2, HLD, HTN, obesity, CVA   OT comments  Patient received in R side lying position in bed with spouse present. Pt was oriented to self only and inconsistently followed one step commands. He ultimately required Total A for repositioning bed, favored R side this date. Pt was resistant to OT assisting with changing hospital gown. He required Max A for grooming tasks at bed level. OT attempted to instruct pt in BUE/LE AROM exercises, however, limited 2/2 cognition. Pt left supine in bed with all needs in reach. D/C recommendation remains appropriate. OT will continue to follow acutely.     Recommendations for follow up therapy are one component of a multi-disciplinary discharge planning process, led by the attending physician.  Recommendations may be updated based on patient status, additional functional criteria and insurance authorization.    Assistance Recommended at Discharge Frequent or constant Supervision/Assistance  Patient can return home with the following  Two people to help with bathing/dressing/bathroom;Two people to help with walking and/or transfers;Assistance with cooking/housework;Assist for transportation;Help with stairs or ramp for entrance;Direct supervision/assist for financial management;Direct supervision/assist for medications management   Equipment Recommendations  Other (comment) (defer to next venue of care)    Recommendations for Other Services       Precautions / Restrictions Precautions Precautions: Fall Restrictions Weight Bearing Restrictions: No       Mobility Bed Mobility Overal bed mobility: Needs Assistance             General bed mobility comments: Received in R side lying this date. Pt resistant to bed mobility even with encouragement from spouse, total A to reposition in bed.    Transfers         Balance Overall balance assessment: Needs assistance     Sitting balance - Comments: not assessed this date         ADL either performed or assessed with clinical judgement   ADL Overall ADL's : Needs assistance/impaired     Grooming: Maximal assistance;Bed level;Wash/dry face Grooming Details (indicate cue type and reason): washcloth placed on dominant hand, multimodal cues to initiate, pt only washing one area of face and ultimately required Max A to finish task       General ADL Comments: Pt resistant to OT assisting with gown change    Extremity/Trunk Assessment              Vision Patient Visual Report: No change from baseline     Perception     Praxis      Cognition Arousal/Alertness: Awake/alert Behavior During Therapy: Flat affect, Agitated Overall Cognitive Status: History of cognitive impairments - at baseline         General Comments: Oriented to self only, inconsistently followed one step commands        Exercises Other Exercises Other Exercises: Attempting to instruct pt in BUE/LE AROM exercises, limited 2/2 cognition. Pt unable to mimic movement with OT modeling exercise. Pt minimally able to wiggle toes and complete dorsiflexion even with Max multimodal cues. Benefitted  from OT cuing pt to give OT a "high five" for shoulder flexion and elbow extension.    Shoulder Instructions       General Comments      Pertinent Vitals/ Pain       Pain Assessment Pain Assessment: No/denies pain  Home Living            Prior Functioning/Environment               Frequency  Min 1X/week        Progress Toward Goals  OT Goals(current goals can now be found in the care plan section)  Progress towards OT goals: OT to reassess next treatment  Acute Rehab OT Goals Patient Stated Goal: to get stronger and go to rehab OT Goal Formulation: With family Time For Goal Achievement: 06/26/22 Potential to Achieve Goals: Fair  Plan Discharge plan remains appropriate;Frequency remains appropriate    Co-evaluation                 AM-PAC OT "6 Clicks" Daily Activity     Outcome Measure   Help from another person eating meals?: A Lot Help from another person taking care of personal grooming?: A Lot Help from another person toileting, which includes using toliet, bedpan, or urinal?: Total Help from another person bathing (including washing, rinsing, drying)?: Total Help from another person to put on and taking off regular upper body clothing?: Total Help from another person to put on and taking off regular lower body clothing?: Total 6 Click Score: 8    End of Session    OT Visit Diagnosis: Unsteadiness on feet (R26.81);Repeated falls (R29.6);Muscle weakness (generalized) (M62.81);History of falling (Z91.81);Other (comment) (L frontal lobe hemorrhage)   Activity Tolerance Other (comment) (cognition)   Patient Left in bed;with call bell/phone within reach;with bed alarm set;with family/visitor present;Other (comment) (B hand mitts donned)   Nurse Communication Mobility status        Time: 1053-1105 OT Time Calculation (min): 12 min  Charges: OT General Charges $OT Visit: 1 Visit OT Treatments $Self Care/Home Management : 8-22 mins  [facility] MS, OTR/L ascom [phone]  06/14/22, 1:32 PM

## 2022-06-14 NOTE — Progress Notes (Signed)
                                                     Palliative Care Progress Note, Assessment & Plan   Patient Name: Philip Carpenter.       Date: 06/14/2022 DOB: 04/20/1945  Age: 77 y.o. MRN#: [id number] Attending Physician: Gillis Santa, MD Primary Care Physician: Marguarite Arbour, MD Admit Date: 06/10/2022  Subjective: Patient is lying in bed in no apparent distress.  He acknowledges my presence.  He is able to make his wishes known.  His wife is at bedside.  HPI: 77 y.o. male  with past medical history of type 2 diabetes, HLD, asthma, HTN, melanoma of scalp, dementia, depression, anxiety, BPH, obesity, and CVA (2014) and admitted on 06/10/2022 from home with moderate-sized hematoma on right occipital scalp cyst s/p mechanical ground-level fall at home.   CT of the head revealed an acute intraparenchymal hematoma within the periventricular white matter of the left paroetoccipital region (16 X10 X 12 mm) with mild adjacent edema.  No underlying adrenal fractures, acute fractures, or traumatic listhesis of the cervical spine were noted.   Neurosurgery was consulted and no indication for surgical intervention at this time.  Repeat CT of head as an outpatient has been recommended in 2 to 3 weeks.   PMT was consulted to discuss goals of care.  Summary of counseling/coordination of care: After reviewing the patient's chart and assessing the patient at bedside, I spoke with patient and wife in regards to plan and goals of care.  Symptoms assessed.  Patient is unable to participate in discussion of his care.  However, wife endorses that patient seems to be "at peace" and has no acute signs of pain, discomfort, agitation, or other issues at this time.  No adjustments to medications needed.  I attempted to elicit goals and values important to the patient and  wife.  Wife continues to say she is hopeful that patient can find placement for rehab.  I shared that TOC is following closely and continuing bed search.  I again discussed CODE STATUS and boundaries of care with patient's wife.  DNR remains.  Wife also endorses she would never want any artificial or life prolonging measures.   PMT will continue to follow and likely complete a MOST form if SNF is found.  Physical Exam Vitals reviewed.  Constitutional:      General: He is not in acute distress.    Appearance: He is normal weight.  Eyes:     Comments: Bruise to right eye  Cardiovascular:     Pulses: Normal pulses.  Pulmonary:     Effort: Pulmonary effort is normal.  Abdominal:     Palpations: Abdomen is soft.  Musculoskeletal:     Comments: Generalized weakness  Skin:    General: Skin is warm and dry.  Neurological:     Mental Status: He is alert.     Comments: Oriented to self             Total Time 35 minutes   Dilan Fullenwider L. Manon Hilding, FNP-BC Palliative Medicine Team Team Phone # [phone]

## 2022-06-14 NOTE — Progress Notes (Signed)
Civil engineer, contracting [facility]) Hospital Liaison Note  Received request from Transitions of Care Manager, Darrian, for hospice services at home after discharge.   MSW contacted spouse/Peggy, unfortunately, Gigi Gin unavailable to speak at the moment as she was at an event. Plan is to follow up 4.18 to begin referral process.    Patient is not an active patient with [facility] services and is not under review for services.   Please call with any questions/concerns.    Thank you for the opportunity to participate in this patient's care.   Eugenie Birks, MSW [facility] Hospital Liaison  [phone]

## 2022-06-14 NOTE — Telephone Encounter (Signed)
Patient is still admitted.

## 2022-06-14 NOTE — Progress Notes (Signed)
[facility] Progress Note  Patient: Philip Carpenter.    ZOX:[id number]  DOA: 06/10/2022     Date of Service: the patient was seen and examined on 06/14/2022  Chief Complaint  Patient presents with   Fall   Brief hospital course: This is a 77 yo male with PMH of Asthma, Type II Diabetes Mellitus, HLD HTN, Melanoma of Scalp, Obesity, CVA,  who presented to [facility] ER on 04/13 following a mechanical fall. Per ER notes pts wife reported the pt fell backwards and hit the back of his head while getting out of a truck. He does not take anticoagulation, but does take aspirin 81 mg daily.   Upon arrival to the ER pt noted to have a moderate size hematoma on the right occipital scalp. He also had a laceration on the dorsal aspect of his right hand requiring 1-2 sutures. CT Head revealed an acute intraparenchymal hematoma within the periventricular white matter of the left parietoccipital region measuring 16 x 10 x 12 mm with mild adjacent edema. ER provider consulted Neurosurgeon Dr. Adriana Simas who recommended admission to ICU for ongoing monitoring, and repeating CT Head in 6hrs. PCCM team contacted for ICU admission.   CT Head/Cervical Spine: Acute intraparenchymal hematoma within the periventricular white matter of the left parietooccipital region measuring 16 x 10 x 12 mm with mild adjacent edema. No significant mass effect or midline shift. Large scalp hematoma overlying the right parietal and occipital regions. No underlying adrenal fracture. No acute fracture or traumatic listhesis of the cervical spine.   Assessment and Plan:  #Acute intraparenchymal hematoma secondary to mechanical fall  #HTN and Hx: CVA and Dementia  Continuous telemetry monitoring  S/p NIH stroke scale q1hr, changed to neurocheck every 4 hourly  S/p Nicardipine gtt, d/c'd on 4/15, keep MAP <110 as per neurosurgery - Stat CT Head for acute neurological changes  - Neurosurgery consulted appreciate input: no indication for  surgical intervention at this time.  Hold aspirin for 7 days, follow-up in 2 to 3 weeks to repeat CT head as an outpatient - Trend CBC  - Fall precautions  - PT/OT eval done, TOC is following for SNF placement   # Hypertension S/p Cardene IV infusion which was DC'd on 4/15 4/17 increased ramipril from 5 mg to 10 mg and started amlodipine 10 mg p.o. daily with holding parameters Use hydralazine as needed Monitor BP and titrate medication accordingly  #Asthma~stable  - Supplemental O2 for dyspnea and/or hypoxia  - Prn bronchodilator therapy    #Hypomagnesia, Mag repleted. #Hypophosphatemia, Phos repleted. - Replace electrolytes as indicated    #Nausea/vomiting  - Prn zofran  - Continue PPI    #Type II diabetes mellitus  - CBG's q4hrs  - Follow hyper/hypoglycemic protocol   Dementia, depression and anxiety Continued home meds Aricept, Zoloft, modafinil,  Ativan as needed for anxiety   BPH, continued Flomax, Proscar and Toviaz Vitamin D level 28, slightly low, started vitamin D supplement. Vitamin B12 level 315, goal> 400, started vitamin B12 IM injection daily during hospital stay followed by oral supplement.   Body mass index is 28.94 kg/m.  Interventions:    Diet: Heart healthy diet DVT Prophylaxis: Subcutaneous Lovenox   Advance goals of care discussion: DNR  Family Communication: family was present at bedside, at the time of interview.  The pt provided permission to discuss medical plan with the family. Opportunity was given to ask question and all questions were answered satisfactorily.   Disposition:  Pt is  from Home, admitted with Fall and intraparenchymal hemorrhage, still has risk of fall, PT/OT eval done, recommend SNF placement.   Discharge to SNF when bed will be available.  Likely stable and medically optimized to discharge   Subjective: No significant events overnight.  Patient was awake and alert, AAO x 1, lying comfortably in the bed without any acute  distress, denies any complaints. As per patient's wife he had headache.   Physical Exam: General: NAD, lying comfortably Appear in no distress, affect appropriate Eyes: PERRLA, Right later eye bruise notice on 4/16 ENT: Oral Mucosa Clear, moist, right parietal and occipital hematoma and bruise Neck: no JVD,  Cardiovascular: S1 and S2 Present, no Murmur,  Respiratory: good respiratory effort, Bilateral Air entry equal and Decreased, no Crackles, no wheezes Abdomen: Bowel Sound present, Soft and no tenderness,  Skin: no rashes Extremities: no Pedal edema, no calf tenderness Neurologic: without any new focal findings Gait not checked due to patient safety concerns  Vitals:   06/13/22 2138 06/13/22 2348 06/14/22 0443 06/14/22 0801  BP: (!) 145/71 (!) 135/45 (!) 106/91 (!) 168/115  Pulse: 87 (!) 47 (!) 49 (!) 55  Resp: Temp: 98.5 F (36.9 C) 98.2 F (36.8 C) 98.1 F (36.7 C) 98 F (36.7 C)  TempSrc:      SpO2: 96% 96% 94% 94%  Weight: 88.8 kg     Height:        Intake/Output Summary (Last 24 hours) at 06/14/2022 1255 Last data filed at 06/13/2022 1900 Gross per 24 hour  Intake 100 ml  Output 201 ml  Net -101 ml   Filed Weights   06/12/22 0200 06/13/22 0500 06/13/22 2138  Weight: 91.5 kg 89.4 kg 88.8 kg    Data Reviewed: I have personally reviewed and interpreted daily labs, tele strips, imagings as discussed above. I reviewed all nursing notes, pharmacy notes, vitals, pertinent old records I have discussed plan of care as described above with RN and patient/family.  CBC: Recent Labs  Lab 06/10/22 1441 06/11/22 0405 06/12/22 0547 06/13/22 0459 06/14/22 0602  WBC 14.0* 11.3* 9.4 8.7 7.9  NEUTROABS  --   --  6.4  --   --   HGB 13.1 12.2* 12.0* 11.0* 11.4*  HCT 40.2 37.3* 37.4* 34.2* 35.0*  MCV 100.0 99.5 101.1* 101.5* 100.0  PLT 195 186 169 162 183   Basic Metabolic Panel: Recent Labs  Lab 06/10/22 1441 06/11/22 0405 06/12/22 0547  06/13/22 0459 06/14/22 0602  NA 139 140 141 142 139  K 4.0 3.5 3.7 3.5 3.6  CL 106 105 105 104 103  CO2 GLUCOSE 69* 127* 164* 171* 207*  BUN CREATININE 0.64 0.58* 0.52* 0.58* 0.58*  CALCIUM 8.3* 8.4* 8.4* 8.1* 8.5*  MG  --  1.7 2.0 1.7 1.8  PHOS  --  3.5 2.4* 2.9 2.6    Studies: No results found.  Scheduled Meds:  amLODipine  10 mg Oral Q1200   atorvastatin  10 mg Oral Daily   cyanocobalamin  1,000 mcg Intramuscular Daily   Followed by   Melene Muller ON 06/20/2022] vitamin B-12  1,000 mcg Oral Daily   divalproex  125 mg Oral BID   donepezil  10 mg Oral Daily   enoxaparin (LOVENOX) injection  40 mg Subcutaneous QPM   fesoterodine  4 mg Oral Daily   finasteride  5 mg Oral Daily   insulin aspart  0-5  Units Subcutaneous QHS   modafinil  100 mg Oral Daily   neomycin-bacitracin-polymyxin   Topical Daily   pantoprazole  40 mg Oral BID   ramipril  10 mg Oral Daily   sertraline  50 mg Oral Daily   tamsulosin  0.4 mg Oral Daily   Vitamin D (Ergocalciferol)  50,000 Units Oral Q7 days   Continuous Infusions:  promethazine (PHENERGAN) injection (IM or IVPB) Stopped (06/11/22 1856)   PRN Meds: acetaminophen, docusate sodium, LORazepam, ondansetron (ZOFRAN) IV, mouth rinse, polyethylene glycol, promethazine (PHENERGAN) injection (IM or IVPB)  Time spent: 40 minutes  Author: Gillis Santa. MD [facility] 06/14/2022 12:55 PM  To reach On-call, see care teams to locate the attending and reach out to them via [email or web address]. If 7PM-7AM, please contact night-coverage If you still have difficulty reaching the attending provider, please page the [facility] (Director on Call) for [facility] on amion for assistance.

## 2022-06-15 DIAGNOSIS — S06310A Contusion and laceration of right cerebrum without loss of consciousness, initial encounter: Secondary | ICD-10-CM | POA: Diagnosis not present

## 2022-06-15 LAB — CBC
HCT: 34.6 % — ABNORMAL LOW (ref 39.0–52.0)
Hemoglobin: 11.5 g/dL — ABNORMAL LOW (ref 13.0–17.0)
MCH: 32.5 pg (ref 26.0–34.0)
MCHC: 33.2 g/dL (ref 30.0–36.0)
MCV: 97.7 fL (ref 80.0–100.0)
Platelets: 207 10*3/uL (ref 150–400)
RBC: 3.54 MIL/uL — ABNORMAL LOW (ref 4.22–5.81)
RDW: 14.1 % (ref 11.5–15.5)
WBC: 7.8 10*3/uL (ref 4.0–10.5)
nRBC: 0 % (ref 0.0–0.2)

## 2022-06-15 LAB — GLUCOSE, CAPILLARY
Glucose-Capillary: 137 mg/dL — ABNORMAL HIGH (ref 70–99)
Glucose-Capillary: 159 mg/dL — ABNORMAL HIGH (ref 70–99)
Glucose-Capillary: 166 mg/dL — ABNORMAL HIGH (ref 70–99)
Glucose-Capillary: 189 mg/dL — ABNORMAL HIGH (ref 70–99)
Glucose-Capillary: 177 mg/dL — ABNORMAL HIGH (ref 70–99)
Glucose-Capillary: 227 mg/dL — ABNORMAL HIGH (ref 70–99)

## 2022-06-15 LAB — BASIC METABOLIC PANEL
Anion gap: 8 (ref 5–15)
BUN: 15 mg/dL (ref 8–23)
CO2: 30 mmol/L (ref 22–32)
Calcium: 8.5 mg/dL — ABNORMAL LOW (ref 8.9–10.3)
Chloride: 102 mmol/L (ref 98–111)
Creatinine, Ser: 0.56 mg/dL — ABNORMAL LOW (ref 0.61–1.24)
GFR, Estimated: >60 mL/min (ref >60)
Glucose, Bld: 171 mg/dL — ABNORMAL HIGH (ref 70–99)
Potassium: 3.1 mmol/L — ABNORMAL LOW (ref 3.5–5.1)
Sodium: 140 mmol/L (ref 135–145)

## 2022-06-15 LAB — MAGNESIUM: Magnesium: 1.6 mg/dL — ABNORMAL LOW (ref 1.7–2.4)

## 2022-06-15 LAB — PHOSPHORUS: Phosphorus: 2.4 mg/dL — ABNORMAL LOW (ref 2.5–4.6)

## 2022-06-15 MED ORDER — MAGNESIUM SULFATE 2 GM/50ML IV SOLN
2.0000 g | Freq: Once | INTRAVENOUS | Status: AC
  Administered 2022-06-15: 2 g via INTRAVENOUS
  Filled 2022-06-15: qty 50

## 2022-06-15 MED ORDER — K PHOS MONO-SOD PHOS DI & MONO 155-852-130 MG PO TABS
500.0000 mg | ORAL_TABLET | Freq: Three times a day (TID) | ORAL | Status: AC
  Administered 2022-06-15 (×3): 500 mg via ORAL
  Filled 2022-06-15 (×3): qty 2

## 2022-06-15 MED ORDER — POTASSIUM CHLORIDE 10 MEQ/100ML IV SOLN
10.0000 meq | INTRAVENOUS | Status: AC
  Administered 2022-06-15 (×4): 10 meq via INTRAVENOUS
  Filled 2022-06-15 (×3): qty 100

## 2022-06-15 MED ORDER — POTASSIUM CHLORIDE 10 MEQ/100ML IV SOLN
10.0000 meq | INTRAVENOUS | Status: DC
  Filled 2022-06-15: qty 100

## 2022-06-15 MED ORDER — POTASSIUM CHLORIDE CRYS ER 20 MEQ PO TBCR
40.0000 meq | EXTENDED_RELEASE_TABLET | Freq: Once | ORAL | Status: AC
  Administered 2022-06-15: 40 meq via ORAL
  Filled 2022-06-15: qty 2

## 2022-06-15 NOTE — Progress Notes (Signed)
                                                     Palliative Care Progress Note   Patient Name: Philip Carpenter.       Date: 06/15/2022 DOB: 28-Nov-1945  Age: 77 y.o. MRN#: [id number] Attending Physician: Gillis Santa, MD Primary Care Physician: Marguarite Arbour, MD Admit Date: 06/10/2022  Chart reviewed.  Family has decided to go home with hospice services at discharge.  TOC following closely.  Goals are clear.  DNR remains. Plan is set for d/c tomorrow.   No acute palliative needs at this time.  PMT will remain available to patient and family throughout his hospitalization.  PMT will shadow the chart and monitor him peripherally.  Please reengage with PMT if goals change, at patient/family's request, or if patient's health deteriorates during this hospitalization.  Thank you for allowing the Palliative Medicine Team to assist in the care of Khayri Kargbo.Dalbert Mayotte Manon Hilding, FNP-BC Palliative Medicine Team Team Phone # [phone]  No charge

## 2022-06-15 NOTE — Care Management Important Message (Signed)
Important Message  Patient Details  Name: Philip Carpenter. MRN: [id number] Date of Birth: 1945-11-17   Medicare Important Message Given:  Yes     Olegario Messier A Mazal Ebey 06/15/2022, 11:28 AM

## 2022-06-15 NOTE — Progress Notes (Signed)
[facility] [employer], [employer] [facility]) Hospital Liaison Note  Received request from Transitions of Care Manager, Darrian,for hospice services at home after discharge. Chart and patient information under review by [facility] physician.   Spoke with patient's wife, Gigi Gin, to initiate education related to hospice philosophy, services, and team approach to care.  Wife verbalized understanding of information given. Per discussion, the plan is for patient to discharge home via EMS once cleared to DC.   Informed wife that Hospice would not provide PT and OT services in the home.  Emphasized the focus would be on comfort and supportive services.     DME needs discussed.  AV nurse to assess.     Address verified and is correct in the chart.  Wife is the family member to contact to arrange time of equipment delivery.    Please send signed and completed DNR home with patient/family. Please provide prescriptions at discharge as needed to ensure ongoing symptom management.    [facility] information and contact numbers given to family & above information shared with TOC.   Please call with any questions/concerns.    Thank you for the opportunity to participate in this patient's care.  [facility] Liaison [phone]

## 2022-06-15 NOTE — Progress Notes (Signed)
[facility] Progress Note  Patient: Philip Carpenter.    ZOX:[id number]  DOA: 06/10/2022     Date of Service: the patient was seen and examined on 06/15/2022  Chief Complaint  Patient presents with   Fall   Brief hospital course: This is a 77 yo male with PMH of Asthma, Type II Diabetes Mellitus, HLD HTN, Melanoma of Scalp, Obesity, CVA,  who presented to [facility] ER on 04/13 following a mechanical fall. Per ER notes pts wife reported the pt fell backwards and hit the back of his head while getting out of a truck. He does not take anticoagulation, but does take aspirin 81 mg daily.   Upon arrival to the ER pt noted to have a moderate size hematoma on the right occipital scalp. He also had a laceration on the dorsal aspect of his right hand requiring 1-2 sutures. CT Head revealed an acute intraparenchymal hematoma within the periventricular white matter of the left parietoccipital region measuring 16 x 10 x 12 mm with mild adjacent edema. ER provider consulted Neurosurgeon Dr. Adriana Simas who recommended admission to ICU for ongoing monitoring, and repeating CT Head in 6hrs. PCCM team contacted for ICU admission.   CT Head/Cervical Spine: Acute intraparenchymal hematoma within the periventricular white matter of the left parietooccipital region measuring 16 x 10 x 12 mm with mild adjacent edema. No significant mass effect or midline shift. Large scalp hematoma overlying the right parietal and occipital regions. No underlying adrenal fracture. No acute fracture or traumatic listhesis of the cervical spine.   Assessment and Plan:  #Acute intraparenchymal hematoma secondary to mechanical fall  #HTN and Hx: CVA and Dementia  Continuous telemetry monitoring  S/p NIH stroke scale q1hr, changed to neurocheck every 4 hourly  S/p Nicardipine gtt, d/c'd on 4/15, keep MAP <110 as per neurosurgery - Stat CT Head for acute neurological changes  - Neurosurgery consulted appreciate input: no indication for  surgical intervention at this time.  Hold aspirin for 7 days, follow-up in 2 to 3 weeks to repeat CT head as an outpatient - Trend CBC  - Fall precautions  - PT/OT eval done, TOC is following for dc planning, most likely discharge home with hospice services when any complaints will be delivered.   # Hypertension S/p Cardene IV infusion which was DC'd on 4/15 4/17 increased ramipril from 5 mg to 10 mg and started amlodipine 10 mg p.o. daily with holding parameters Use hydralazine as needed Monitor BP and titrate medication accordingly  #Asthma~stable  - Supplemental O2 for dyspnea and/or hypoxia  - Prn bronchodilator therapy    # Hypokalemia, potassium repleted. #Hypomagnesia, Mag repleted. #Hypophosphatemia, Phos repleted. 4/18 no more blood draws as per patient's wife, so we will not monitor electrolytes anymore.    #Nausea/vomiting  - Prn zofran  - Continue PPI    #Type II diabetes mellitus  - CBG's q4hrs  - Follow hyper/hypoglycemic protocol   # Dementia, depression and anxiety Continued home meds Aricept, Zoloft, modafinil,  Ativan as needed for anxiety # BPH, continued Flomax, Proscar and Toviaz # Vitamin D level 28, slightly low, started vitamin D supplement. # Vitamin B12 level 315, goal> 400, started vitamin B12 IM injection daily during hospital stay followed by oral supplement.   Body mass index is 28.94 kg/m.  Interventions:    Diet: Heart healthy diet DVT Prophylaxis: Subcutaneous Lovenox   Advance goals of care discussion: DNR  Family Communication: family was present at bedside, at the time of interview.  The pt provided permission to discuss medical plan with the family. Opportunity was given to ask question and all questions were answered satisfactorily.   Disposition:  Pt is from Home, admitted with Fall and intraparenchymal hemorrhage, still has risk of fall, PT/OT eval done, recommend SNF placement.  Palliative care consulted for goals of care  discussion, patient 5 days hide to take him home with hospice services. TOC is following for DC planning.  Electrolyte imbalance, we will replete electrolytes today, no more blood draws tomorrow and plan for discharge tomorrow a.m.   Subjective: No significant events overnight.  Patient was sleepy, woke up briefly, AO x 1.  Does not remember anything from yesterday.  Denies any headache or dizziness. Management plan discussed with patient's wife at bedside.  Plan is to discharge him home tomorrow with hospice services.    Physical Exam: General: NAD, lying comfortably Appear in no distress, affect appropriate Eyes: PERRLA, Right later eye bruise notice on 4/16 ENT: Oral Mucosa Clear, moist, right parietal and occipital hematoma and bruise Neck: no JVD,  Cardiovascular: S1 and S2 Present, no Murmur,  Respiratory: good respiratory effort, Bilateral Air entry equal and Decreased, no Crackles, no wheezes Abdomen: Bowel Sound present, Soft and no tenderness,  Skin: no rashes Extremities: no Pedal edema, no calf tenderness Neurologic: without any new focal findings Gait not checked due to patient safety concerns  Vitals:   06/15/22 0144 06/15/22 0511 06/15/22 0603 06/15/22 0756  BP: (!) 154/81 (!) 148/63  (!) 132/56  Pulse: 61 (!) 57    Resp: Temp: 97.6 F (36.4 C) 97.7 F (36.5 C)  97.9 F (36.6 C)  TempSrc: Oral Oral    SpO2: 95% 96%  96%  Weight:   88.8 kg   Height:        Intake/Output Summary (Last 24 hours) at 06/15/2022 1059 Last data filed at 06/14/2022 2013 Gross per 24 hour  Intake --  Output 1200 ml  Net -1200 ml   Filed Weights   06/13/22 0500 06/13/22 2138 06/15/22 0603  Weight: 89.4 kg 88.8 kg 88.8 kg    Data Reviewed: I have personally reviewed and interpreted daily labs, tele strips, imagings as discussed above. I reviewed all nursing notes, pharmacy notes, vitals, pertinent old records I have discussed plan of care as described above with RN  and patient/family.  CBC: Recent Labs  Lab 06/11/22 0405 06/12/22 0547 06/13/22 0459 06/14/22 0602 06/15/22 0514  WBC 11.3* 9.4 8.7 7.9 7.8  NEUTROABS  --  6.4  --   --   --   HGB 12.2* 12.0* 11.0* 11.4* 11.5*  HCT 37.3* 37.4* 34.2* 35.0* 34.6*  MCV 99.5 101.1* 101.5* 100.0 97.7  PLT 186 169 162 183 207   Basic Metabolic Panel: Recent Labs  Lab 06/11/22 0405 06/12/22 0547 06/13/22 0459 06/14/22 0602 06/15/22 0514  NA 140 141 142 139 140  K 3.5 3.7 3.5 3.6 3.1*  CL 105 105 104 103 102  CO2 GLUCOSE 127* 164* 171* 207* 171*  BUN CREATININE 0.58* 0.52* 0.58* 0.58* 0.56*  CALCIUM 8.4* 8.4* 8.1* 8.5* 8.5*  MG 1.7 2.0 1.7 1.8 1.6*  PHOS 3.5 2.4* 2.9 2.6 2.4*    Studies: No results found.  Scheduled Meds:  amLODipine  10 mg Oral Q1200   atorvastatin  10 mg Oral Daily   cyanocobalamin  1,000 mcg Intramuscular Daily   Followed by   [  START ON 06/20/2022] vitamin B-12  1,000 mcg Oral Daily   divalproex  125 mg Oral BID   donepezil  10 mg Oral Daily   enoxaparin (LOVENOX) injection  40 mg Subcutaneous QPM   fesoterodine  4 mg Oral Daily   finasteride  5 mg Oral Daily   insulin aspart  0-9 Units Subcutaneous Q4H   modafinil  100 mg Oral Daily   neomycin-bacitracin-polymyxin   Topical Daily   pantoprazole  40 mg Oral BID   phosphorus  500 mg Oral TID   potassium chloride  40 mEq Oral Once   ramipril  10 mg Oral Daily   sertraline  50 mg Oral Daily   tamsulosin  0.4 mg Oral Daily   Vitamin D (Ergocalciferol)  50,000 Units Oral Q7 days   Continuous Infusions:  magnesium sulfate bolus IVPB 2 g (06/15/22 1058)   potassium chloride     promethazine (PHENERGAN) injection (IM or IVPB) 12.5 mg (06/14/22 1543)   PRN Meds: acetaminophen, docusate sodium, LORazepam, ondansetron (ZOFRAN) IV, mouth rinse, polyethylene glycol, promethazine (PHENERGAN) injection (IM or IVPB)  Time spent: 35 minutes  Author: Gillis Santa. MD [facility] 06/15/2022 10:59 AM  To reach On-call, see care teams to locate the attending and reach out to them via [email or web address]. If 7PM-7AM, please contact night-coverage If you still have difficulty reaching the attending provider, please page the [facility] (Director on Call) for [facility] on amion for assistance.

## 2022-06-15 NOTE — Progress Notes (Signed)
Physical Therapy Treatment Patient Details Name: Philip Carpenter. MRN: [id number] DOB: [birth year] Today's Date: 06/15/2022   History of Present Illness Pt is a 77 y/o M admitted on 06/10/22 after presenting with c/o falling backwards & hitting head. Head CT showed intraparenchymal hematoma within the periventricular white matter of the left parietoccipital region measuring 16 x 10 x 12 mm with mild adjacent edema. Neurosurgery was consulted & recommended admission to ICU for ongoing monitoring & repeat CT head. PMH: asthma, DM2, HLD, HTN, obesity, CVA    PT Comments    Pt was long sitting in bed with supportive spouse at bedside. He is much more agreeable and cooperative today versus previous few days. He does however remain disoriented and confused. Less encouragement and assistance required to achieve EOB short sit. He stood 3 x EOB with +2 HHA + max assist fo two. On the last STS, pt has explosive BM that required full linen change and then pt to get bath. RN techs assisted. Per spouse," We are planning to DC home tomorrow." PT rec remain the same however if/when pt Dcs home, recommended as much service [facility]) and equipment as possible to maximize pt's success while decreasing caregiver burden.    Recommendations for follow up therapy are one component of a multi-disciplinary discharge planning process, led by the attending physician.  Recommendations may be updated based on patient status, additional functional criteria and insurance authorization.     Assistance Recommended at Discharge Frequent or constant Supervision/Assistance  Patient can return home with the following Two people to help with walking and/or transfers;Two people to help with bathing/dressing/bathroom;Assistance with cooking/housework;Assistance with feeding;Direct supervision/assist for medications management;Direct supervision/assist for financial management;Assist for transportation;Help with stairs or ramp for entrance    Equipment Recommendations  Rolling walker (2 wheels);BSC/3in1;Wheelchair (measurements PT);Wheelchair cushion (measurements PT);Hospital bed (if DCing home)       Precautions / Restrictions Precautions Precautions: Fall Restrictions Weight Bearing Restrictions: No     Mobility  Bed Mobility Overal bed mobility: Needs Assistance Bed Mobility: Supine to Sit, Sit to Supine Rolling: Max assist Supine to sit: Max assist, +2 for safety/equipment Sit to supine: Total assist, +2 for safety/equipment    Transfers Overall transfer level: Needs assistance Equipment used: 2 person hand held assist Transfers: Sit to/from Stand Sit to Stand: Max assist, +2 safety/equipment, +2 physical assistance, From elevated surface    General transfer comment: Pt stood 3 x EOB with +2 assist. On 3rd attempt, pt has explosive BM and required full linen change/bath. RN staff assisted Chartered loss adjuster and spouse    Ambulation/Gait    General Gait Details: unsafe to advance away form EOB    Balance Overall balance assessment: Needs assistance Sitting-balance support: Feet supported, Bilateral upper extremity supported Sitting balance-Leahy Scale: Fair Sitting balance - Comments: much improved balance at EOB however pt is inconsistent   Standing balance support: Bilateral upper extremity supported, During functional activity Standing balance-Leahy Scale: Poor        Cognition Arousal/Alertness: Awake/alert Behavior During Therapy: Flat affect Overall Cognitive Status: History of cognitive impairments - at baseline      General Comments: pt was more agreeable to participate this session however remains confused and resistive at times. opriented to self only. Supportive spouse/caregiver present throughout               Pertinent Vitals/Pain Pain Assessment Pain Assessment: No/denies pain Faces Pain Scale: No hurt Pain Intervention(s): Limited activity within patient's tolerance, Monitored during  session, Premedicated before  session, Repositioned     PT Goals (current goals can now be found in the care plan section) Acute Rehab PT Goals Patient Stated Goal: go home Progress towards PT goals: Progressing toward goals    Frequency    Min 3X/week      PT Plan Current plan remains appropriate       AM-PAC PT "6 Clicks" Mobility   Outcome Measure  Help needed turning from your back to your side while in a flat bed without using bedrails?: A Little Help needed moving from lying on your back to sitting on the side of a flat bed without using bedrails?: A Lot Help needed moving to and from a bed to a chair (including a wheelchair)?: Total Help needed standing up from a chair using your arms (e.g., wheelchair or bedside chair)?: Total Help needed to walk in hospital room?: Total Help needed climbing 3-5 steps with a railing? : Total 6 Click Score: 9    End of Session   Activity Tolerance: Patient tolerated treatment well;Other (comment) (limited by [facility]) Patient left: in bed;with call bell/phone within reach;with bed alarm set;Other (comment);with family/visitor present Nurse Communication: Mobility status PT Visit Diagnosis: Difficulty in walking, not elsewhere classified (R26.2);Other abnormalities of gait and mobility (R26.89);Unsteadiness on feet (R26.81)     Time: 9147-8295 PT Time Calculation (min) (ACUTE ONLY): 28 min  Charges:  $Therapeutic Activity: 23-37 mins                     Jetta Lout PTA 06/15/22, 4:30 PM

## 2022-06-16 DIAGNOSIS — S06310A Contusion and laceration of right cerebrum without loss of consciousness, initial encounter: Secondary | ICD-10-CM | POA: Diagnosis not present

## 2022-06-16 LAB — GLUCOSE, CAPILLARY
Glucose-Capillary: 128 mg/dL — ABNORMAL HIGH (ref 70–99)
Glucose-Capillary: 177 mg/dL — ABNORMAL HIGH (ref 70–99)
Glucose-Capillary: 182 mg/dL — ABNORMAL HIGH (ref 70–99)
Glucose-Capillary: 194 mg/dL — ABNORMAL HIGH (ref 70–99)
Glucose-Capillary: 214 mg/dL — ABNORMAL HIGH (ref 70–99)

## 2022-06-16 MED ORDER — VITAMIN D (ERGOCALCIFEROL) 1.25 MG (50000 UNIT) PO CAPS: 50000.0000 [IU] | ORAL_CAPSULE | ORAL | 2 refills | Status: DC

## 2022-06-16 MED ORDER — CYANOCOBALAMIN 1000 MCG PO TABS: 1000.0000 ug | ORAL_TABLET | Freq: Every day | ORAL | 2 refills | Status: DC

## 2022-06-16 MED ORDER — ACETAMINOPHEN 325 MG PO TABS: 650.0000 mg | ORAL_TABLET | Freq: Four times a day (QID) | ORAL | Status: DC | PRN

## 2022-06-16 MED ORDER — POLYETHYLENE GLYCOL 3350 17 G PO PACK: 17.0000 g | PACK | Freq: Every day | ORAL | 0 refills | Status: DC | PRN

## 2022-06-16 MED ORDER — AMLODIPINE BESYLATE 10 MG PO TABS: 10.0000 mg | ORAL_TABLET | Freq: Every day | ORAL | 2 refills | Status: DC

## 2022-06-16 MED ORDER — HYDRALAZINE HCL 50 MG PO TABS: 50.0000 mg | ORAL_TABLET | Freq: Three times a day (TID) | ORAL | 0 refills | Status: AC | PRN

## 2022-06-16 MED ORDER — AMLODIPINE BESYLATE 5 MG PO TABS: 5.0000 mg | ORAL_TABLET | Freq: Every day | ORAL | 2 refills | Status: DC

## 2022-06-16 NOTE — Progress Notes (Signed)
Received  MD order to discharge patient to home with hospice,reviewed discharge instructions, home meds, prescriptions and follow up appointments with patient and wife Gigi Gin and she verbalized understanding

## 2022-06-16 NOTE — Discharge Summary (Signed)
[facility] Discharge Summary   Patient: Philip Carpenter. ZOX:[id number]  PCP: Marguarite Arbour, MD  Date of admission: 06/10/2022   Date of discharge: 06/16/2022     Discharge Diagnoses:  Principal Problem:   Intraparenchymal hematoma of brain Active Problems:   Intracranial hemorrhage   Admitted From: Home Disposition:  Home with hospice services  Recommendations for Outpatient Follow-up:  Follow hospice care Follow up LABS/TEST: None   Diet recommendation: Regular diet  Activity: The patient is advised to gradually reintroduce usual activities, as tolerated  Discharge Condition: stable  Code Status: Full code   History of present illness: As per the H and P dictated on admission Hospital Course:  This is a 77 yo male with PMH of Asthma, Type II Diabetes Mellitus, HLD HTN, Melanoma of Scalp, Obesity, CVA,  who presented to [facility] ER on 04/13 following a mechanical fall. Per ER notes pts wife reported the pt fell backwards and hit the back of his head while getting out of a truck. He does not take anticoagulation, but does take aspirin 81 mg daily.    Upon arrival to the ER pt noted to have a moderate size hematoma on the right occipital scalp. He also had a laceration on the dorsal aspect of his right hand requiring 1-2 sutures. CT Head revealed an acute intraparenchymal hematoma within the periventricular white matter of the left parietoccipital region measuring 16 x 10 x 12 mm with mild adjacent edema. ER provider consulted Neurosurgeon Dr. Adriana Simas who recommended admission to ICU for ongoing monitoring, and repeating CT Head in 6hrs. PCCM team contacted for ICU admission.    CT Head/Cervical Spine: Acute intraparenchymal hematoma within the periventricular white matter of the left parietooccipital region measuring 16 x 10 x 12 mm with mild adjacent edema. No significant mass effect or midline shift. Large scalp hematoma overlying the right parietal and occipital regions.  No underlying adrenal fracture. No acute fracture or traumatic listhesis of the cervical spine.    Assessment and Plan: #Acute intraparenchymal hematoma secondary to mechanical fall  #HTN and Hx: CVA and Dementia  S/p NIH stroke scale q1hr, changed to neurocheck every 4 hourly  S/p Nicardipine gtt, d/c'd on 4/15, keep MAP <110 as per neurosurgery Neurosurgery consulted appreciate input: no indication for surgical intervention at this time.  Hold aspirin for 7 days, follow-up in 2 to 3 weeks to repeat CT head as an outpatient. PT/OT eval done, TOC was following for dc planning.  Palliative care was consulted, patient's wife agreed for hospice services at home.   # Hypertension, S/p Cardene IV infusion which was DC'd on 4/15 4/17 increased ramipril from 5 mg to 10 mg and started amlodipine 10 mg p.o. daily with holding parameters. monitor BP and titrate medication as per hospice team  #Asthma~stable, Prn bronchodilator therapy  # Hypokalemia, potassium repleted. #Hypomagnesia, Mag repleted. #Hypophosphatemia, Phos repleted. 4/18 no more blood draws as per patient's wife. So no labs on 4/19 #Nausea/vomiting resolved s/p Prn zofran. Continue PPI  #Type II diabetes mellitus, resumed home meds on discharge. # Dementia, depression and anxiety, Continued home meds Aricept, Zoloft, modafinil,  Ativan as needed for anxiety # BPH, continued Flomax, Proscar and Toviaz # Vitamin D level 28, slightly low, started vitamin D supplement. # Vitamin B12 level 315, goal> 400, started vitamin B12 IM injection daily during hospital stay followed by oral supplement. Body mass index is 28.09 kg/m.  Nutrition Interventions:   - Patient was instructed, not to drive,  operate heavy machinery, perform activities at heights, swimming or participation in water activities or provide baby sitting services while on Pain, Sleep and Anxiety Medications; until his outpatient Physician has advised to do so again.  - Also  recommended to not to take more than prescribed Pain, Sleep and Anxiety Medications.  Patient was seen by physical therapy, who recommended Therapy.  Palliative care was consulted and patient's wife agreed for home hospice.   On the day of the discharge the patient's vitals were stable, and no other acute medical condition were reported by patient. the patient was felt safe to be discharge at Home with hospice services..  Consultants: PCCM, neurosurgery, palliative care and hospice service Procedures: None  Discharge Exam: General: Appear in no distress, no Rash; Oral Mucosa Clear, moist. Cardiovascular: S1 and S2 Present, no Murmur, Respiratory: normal respiratory effort, Bilateral Air entry present and no Crackles, no wheezes Abdomen: Bowel Sound present, Soft and no tenderness, no hernia Extremities: no Pedal edema, no calf tenderness Neurology: alert and oriented to time, place, and person affect appropriate.  Filed Weights   06/13/22 0500 06/13/22 2138 06/15/22 0603  Weight: 89.4 kg 88.8 kg 88.8 kg   Vitals:   06/16/22 0727 06/16/22 1147  BP: (!) 119/59 (!) 109/55  Pulse: (!) 59 62  Resp: 18 16  Temp: 97.9 F (36.6 C)   SpO2: 90%     DISCHARGE MEDICATION: Allergies as of 06/16/2022       Reactions   Metformin Diarrhea   Patient able to tolerate 500 mg XR        Medication List     TAKE these medications    acetaminophen 325 MG tablet Commonly known as: TYLENOL Take 2 tablets (650 mg total) by mouth every 6 (six) hours as needed for mild pain, moderate pain, fever or headache (temp > 101.5).   amLODipine 5 MG tablet Commonly known as: NORVASC Take 1 tablet (5 mg total) by mouth daily at 12 noon. Skip the dose if systolic BP less than 140 mmHg   aspirin 81 MG chewable tablet Chew 1 tablet (81 mg total) by mouth daily. Start taking on: June 18, 2022 What changed: These instructions start on June 18, 2022. If you are unsure what to do until then, ask your  doctor or other care provider.   atorvastatin 10 MG tablet Commonly known as: LIPITOR Take 10 mg by mouth daily.   Basaglar KwikPen 100 UNIT/ML Inject 20 Units into the skin at bedtime.   cyanocobalamin 1000 MCG tablet Take 1 tablet (1,000 mcg total) by mouth daily. Start taking on: June 20, 2022   divalproex 125 MG DR tablet Commonly known as: DEPAKOTE Take 125 mg by mouth 2 (two) times daily.   donepezil 10 MG tablet Commonly known as: ARICEPT Take 10 mg by mouth daily.   finasteride 5 MG tablet Commonly known as: PROSCAR TAKE 1 TABLET (5 MG TOTAL) BY MOUTH DAILY.   glipiZIDE 10 MG tablet Commonly known as: GLUCOTROL Take 10 mg by mouth daily before breakfast.   hydrALAZINE 50 MG tablet Commonly known as: APRESOLINE Take 1 tablet (50 mg total) by mouth 3 (three) times daily as needed (Follow-up systolic BP greater than 150 mmHg). What changed:  medication strength how much to take when to take this reasons to take this   insulin lispro 100 UNIT/ML injection Commonly known as: HUMALOG Inject 0-100 Units into the skin 3 (three) times daily before meals.   LORazepam 0.5 MG tablet Commonly  known as: ATIVAN Take 1 tablet (0.5 mg total) by mouth every 8 (eight) hours as needed. What changed: when to take this   metFORMIN 500 MG 24 hr tablet Commonly known as: GLUCOPHAGE-XR Take 500 mg by mouth daily with breakfast.   modafinil 100 MG tablet Commonly known as: PROVIGIL Take 100 mg by mouth daily.   Multi-Vitamins Tabs Take by mouth.   ondansetron 4 MG tablet Commonly known as: ZOFRAN Take 4 mg by mouth every 8 (eight) hours as needed for nausea, vomiting or refractory nausea / vomiting.   OneTouch Verio test strip Generic drug: glucose blood 3 (three) times daily.   pantoprazole 40 MG tablet Commonly known as: PROTONIX Take 40 mg by mouth 2 (two) times daily.   polyethylene glycol 17 g packet Commonly known as: MIRALAX / GLYCOLAX Take 17 g by mouth  daily as needed for moderate constipation.   ramipril 5 MG capsule Commonly known as: ALTACE Take 5 mg by mouth daily.   sertraline 50 MG tablet Commonly known as: ZOLOFT Take 1 tablet by mouth daily.   tamsulosin 0.4 MG Caps capsule Commonly known as: FLOMAX Take 0.4 mg by mouth daily.   trospium 20 MG tablet Commonly known as: SANCTURA Take 1 tablet (20 mg total) by mouth 2 (two) times daily.   Vitamin D (Ergocalciferol) 1.25 MG (50000 UNIT) Caps capsule Commonly known as: DRISDOL Take 1 capsule (50,000 Units total) by mouth every 7 (seven) days. Start taking on: June 20, 2022       Allergies  Allergen Reactions   Metformin Diarrhea    Patient able to tolerate 500 mg XR   Discharge Instructions     Diet - low sodium heart healthy   Complete by: As directed    Discharge instructions   Complete by: As directed    F/u Hospice Care   Increase activity slowly   Complete by: As directed    No wound care   Complete by: As directed        The results of significant diagnostics from this hospitalization (including imaging, microbiology, ancillary and laboratory) are listed below for reference.    Significant Diagnostic Studies: CT HEAD WO CONTRAST ( )  Result Date: 06/11/2022 CLINICAL DATA:  Head trauma, moderate-severe Intraparenchymal hematoma EXAM: CT HEAD WITHOUT CONTRAST TECHNIQUE: Contiguous axial images were obtained from the base of the skull through the vertex without intravenous contrast. RADIATION DOSE REDUCTION: This exam was performed according to the departmental dose-optimization program which includes automated exposure control, adjustment of the mA and/or kV according to patient size and/or use of iterative reconstruction technique. COMPARISON:  CT head 06/10/2022 FINDINGS: Brain: Cerebral ventricle sizes are concordant with the degree of cerebral volume loss. Patchy and confluent areas of decreased attenuation are noted throughout the deep and  periventricular white matter of the cerebral hemispheres bilaterally, compatible with chronic microvascular ischemic disease. No evidence of large-territorial acute infarction. Grossly stable 2.8 x 2.1 cm intraparenchymal hematoma centered at the parasagittal posterior left frontal region. Redemonstration of intraventricular extension of the hemorrhage. Associated mild vasogenic edema. Stable 2 mm left-to-right midline shift. No hydrocephalus. Basilar cisterns are patent. Vascular: No hyperdense vessel. Atherosclerotic calcifications are present within the cavernous internal carotid and vertebral arteries. Skull: No acute fracture or focal lesion. Sinuses/Orbits: Left maxillary sinus mucosal thickening. Otherwise paranasal sinuses and mastoid air cells are clear. Bilateral lens replacement. Otherwise the orbits are unremarkable. Other: Persistent up to 9 mm right scalp hematoma. IMPRESSION: Grossly stable 2.8 by 2.1  cm intraparenchymal posterior left frontal lobe hemorrhage with intraventricular extension. Stable 2 mm left-to-right midline shift. Electronically Signed   By: Tish Frederickson M.D.   On: 06/11/2022 18:35   DG Abd 1 View  Result Date: 06/11/2022 CLINICAL DATA:  101717 Nausea 101717 EXAM: ABDOMEN - 1 VIEW.  Right flank collimated off view. COMPARISON:  CT abdomen pelvis 09/12/2006 FINDINGS: The bowel gas pattern is normal. Right upper quadrant surgical clips. No radio-opaque calculi or other significant radiographic abnormality are seen. Multilevel degenerative changes of the spine. 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 IMPRESSION: 1. Nonobstructive bowel gas pattern. 2. Right flank collimated off view. Electronically Signed   By: Tish Frederickson M.D.   On: 06/11/2022 18:31   CT HEAD WO CONTRAST ( )  Result Date: 06/10/2022 CLINICAL DATA:  Follow-up examination for trauma. EXAM: CT HEAD WITHOUT CONTRAST TECHNIQUE: Contiguous axial images were obtained from the base of the skull through the vertex  without intravenous contrast. RADIATION DOSE REDUCTION: This exam was performed according to the departmental dose-optimization program which includes automated exposure control, adjustment of the mA and/or kV according to patient size and/or use of iterative reconstruction technique. COMPARISON:  CT from earlier the same day. FINDINGS: Brain: Previously identified intraparenchymal hematoma centered at the parasagittal posterior left frontal region again seen, relatively stable in size and morphology as compared to previous measuring up to approximate 3 cm in size. Mild surrounding edema without significant regional mass effect or midline shift. Intraventricular extension with blood seen layering within the left greater than right occipital horns of both lateral ventricles, mildly increased from prior. Ventricular size remains relatively stable without progressive hydrocephalus. No new intracranial hemorrhage. No other acute large vessel territory infarct. No mass lesion or extra-axial fluid collection. Underlying atrophy with chronic small vessel ischemic disease. Remote lacunar infarct noted at the left thalamus. Vascular: No abnormal hyperdense vessel. Scattered calcified atherosclerosis present at the skull base. Skull: Large evolving right parietal scalp hematoma. Calvarium is intact. Sinuses/Orbits: Globes and orbital soft tissues within normal limits. Paranasal sinuses are clear. Trace left mastoid effusion noted, of doubtful significance. Other: None. IMPRESSION: 1. Relatively stable size and morphology of intraparenchymal hematoma centered at the parasagittal posterior left frontal region. Mild surrounding edema without significant regional mass effect or midline shift. 2. Intraventricular extension with blood layering within the left greater than right occipital horns of both lateral ventricles, mildly increased from prior. Ventricular size remains relatively stable without progressive hydrocephalus. 3.  Large evolving right parietal scalp hematoma. 4. No other new acute intracranial abnormality. Electronically Signed   By: Rise Mu M.D.   On: 06/10/2022 23:52   DG Chest Port 1 View  Result Date: 06/10/2022 CLINICAL DATA:  Aspiration and respiratory tract. Fall. EXAM: PORTABLE CHEST 1 VIEW COMPARISON:  Radiograph 03/30/2022 FINDINGS: Lung volumes are low.The cardiomediastinal contours are stable. Pulmonary vasculature is normal. No consolidation, pleural effusion, or pneumothorax. No acute osseous abnormalities are seen. IMPRESSION: Hypoventilatory chest without acute abnormality. Electronically Signed   By: Narda Rutherford M.D.   On: 06/10/2022 18:16   CT HEAD WO CONTRAST ( )  Result Date: 06/10/2022 CLINICAL DATA:  Head trauma, moderate-severe EXAM: CT HEAD WITHOUT CONTRAST TECHNIQUE: Contiguous axial images were obtained from the base of the skull through the vertex without intravenous contrast. RADIATION DOSE REDUCTION: This exam was performed according to the departmental dose-optimization program which includes automated exposure control, adjustment of the mA and/or kV according to patient size and/or use of iterative reconstruction technique. COMPARISON:  CT head from  the same day. FINDINGS: Motion limited study. Brain: Interval increase in size of the acute intraparenchymal hematoma in the periventricular left parieto-occipital region, now measuring 2.7 x 2.2 x 2.1 cm (estimated volume of 6.2 mL). Mild regional mass effect. No significant midline shift. New/interval intraventricular extension of hemorrhage with small volume of hemorrhage layering in the occipital horn left lateral ventricle. No evidence of acute large vascular territory infarct on this motion limited study. No visible mass lesion. No hydrocephalus. Ex vacuo ventricular dilation with cerebral atrophy. Vascular: No hyperdense vessel identified. Skull: High right posterior scalp contusion. No visible acute fracture.  Sinuses/Orbits: Clear sinuses.  No acute orbital findings. Other: No mastoid effusions. IMPRESSION: 1. Interval increase in size of the acute intraparenchymal hematoma in the periventricular left parieto-occipital region now measuring up to 2.7 cm. 2. New/interval intraventricular extension of hemorrhage with small volume of hemorrhage layering in the occipital horn left lateral ventricle. Findings discussed with provider Delton See via telephone at 6:10 p.m. Electronically Signed   By: Feliberto Harts M.D.   On: 06/10/2022 18:12   CT Cervical Spine Wo Contrast  Result Date: 06/10/2022 CLINICAL DATA:  Neck trauma (Age >= 65y) EXAM: CT CERVICAL SPINE WITHOUT CONTRAST TECHNIQUE: Multidetector CT imaging of the cervical spine was performed without intravenous contrast. Multiplanar CT image reconstructions were also generated. RADIATION DOSE REDUCTION: This exam was performed according to the departmental dose-optimization program which includes automated exposure control, adjustment of the mA and/or kV according to patient size and/or use of iterative reconstruction technique. COMPARISON:  CT cervical spine June 10, 2022 FINDINGS: Alignment: Straightening.  No substantial sagittal subluxation. Skull base and vertebrae: No acute fracture. Vertebral body heights are maintained. Soft tissues and spinal canal: No prevertebral fluid or swelling. No visible canal hematoma. Disc levels: Moderate multilevel degenerative disc disease and facet/uncovertebral hypertrophy with varying degrees of neural foraminal stenosis. Upper chest: Visualized lung apices are clear. IMPRESSION: No evidence of acute fracture or traumatic malalignment. Electronically Signed   By: Feliberto Harts M.D.   On: 06/10/2022 17:59   CT HEAD WO CONTRAST ( )  Result Date: 06/10/2022 CLINICAL DATA:  Fall, head and neck trauma EXAM: CT HEAD WITHOUT CONTRAST CT CERVICAL SPINE WITHOUT CONTRAST TECHNIQUE: Multidetector CT imaging of the head and  cervical spine was performed following the standard protocol without intravenous contrast. Multiplanar CT image reconstructions of the cervical spine were also generated. RADIATION DOSE REDUCTION: This exam was performed according to the departmental dose-optimization program which includes automated exposure control, adjustment of the mA and/or kV according to patient size and/or use of iterative reconstruction technique. COMPARISON:  03/30/2022 FINDINGS: CT HEAD FINDINGS Brain: Acute intraparenchymal hematoma within the periventricular white matter of the left parietooccipital region measuring 16 x 10 x 12 mm (series 2, image 22). Mild adjacent edema. No significant mass effect or midline shift. No additional sites of intracranial hemorrhage. No extra-axial collection. No intraventricular hemorrhage or hydrocephalus. No acute large territory infarction. Patchy low-density changes within the periventricular and subcortical white matter most compatible with chronic microvascular ischemic change. Mild diffuse cerebral volume loss. Vascular: Atherosclerotic calcifications involving the large vessels of the skull base. No unexpected hyperdense vessel. Skull: Negative for calvarial fracture. Sinuses/Orbits: No acute finding. Other: Large scalp hematoma overlying the right parietal and occipital regions. CT CERVICAL SPINE FINDINGS Alignment: Facet joints are aligned without dislocation or traumatic listhesis. Dens and lateral masses are aligned. Skull base and vertebrae: No acute fracture. No primary bone lesion or focal pathologic process. Soft tissues and  spinal canal: No prevertebral fluid or swelling. No visible canal hematoma. Disc levels: Degenerative disc disease of C5-6 and C6-7. Multilevel facet arthropathy. Upper chest: Negative. Other: Bilateral carotid atherosclerosis. IMPRESSION: 1. Acute intraparenchymal hematoma within the periventricular white matter of the left parietooccipital region measuring 16 x 10 x  12 mm with mild adjacent edema. No significant mass effect or midline shift. 2. Large scalp hematoma overlying the right parietal and occipital regions. No underlying adrenal fracture. 3. No acute fracture or traumatic listhesis of the cervical spine. Critical Value/emergent results were called by telephone at the time of interpretation on 06/10/2022 at 1:55 pm to provider [facility] , who verbally acknowledged these results. Electronically Signed   By: Duanne Guess D.O.   On: 06/10/2022 13:56   CT Cervical Spine Wo Contrast  Result Date: 06/10/2022 CLINICAL DATA:  Fall, head and neck trauma EXAM: CT HEAD WITHOUT CONTRAST CT CERVICAL SPINE WITHOUT CONTRAST TECHNIQUE: Multidetector CT imaging of the head and cervical spine was performed following the standard protocol without intravenous contrast. Multiplanar CT image reconstructions of the cervical spine were also generated. RADIATION DOSE REDUCTION: This exam was performed according to the departmental dose-optimization program which includes automated exposure control, adjustment of the mA and/or kV according to patient size and/or use of iterative reconstruction technique. COMPARISON:  03/30/2022 FINDINGS: CT HEAD FINDINGS Brain: Acute intraparenchymal hematoma within the periventricular white matter of the left parietooccipital region measuring 16 x 10 x 12 mm (series 2, image 22). Mild adjacent edema. No significant mass effect or midline shift. No additional sites of intracranial hemorrhage. No extra-axial collection. No intraventricular hemorrhage or hydrocephalus. No acute large territory infarction. Patchy low-density changes within the periventricular and subcortical white matter most compatible with chronic microvascular ischemic change. Mild diffuse cerebral volume loss. Vascular: Atherosclerotic calcifications involving the large vessels of the skull base. No unexpected hyperdense vessel. Skull: Negative for calvarial fracture.  Sinuses/Orbits: No acute finding. Other: Large scalp hematoma overlying the right parietal and occipital regions. CT CERVICAL SPINE FINDINGS Alignment: Facet joints are aligned without dislocation or traumatic listhesis. Dens and lateral masses are aligned. Skull base and vertebrae: No acute fracture. No primary bone lesion or focal pathologic process. Soft tissues and spinal canal: No prevertebral fluid or swelling. No visible canal hematoma. Disc levels: Degenerative disc disease of C5-6 and C6-7. Multilevel facet arthropathy. Upper chest: Negative. Other: Bilateral carotid atherosclerosis. IMPRESSION: 1. Acute intraparenchymal hematoma within the periventricular white matter of the left parietooccipital region measuring 16 x 10 x 12 mm with mild adjacent edema. No significant mass effect or midline shift. 2. Large scalp hematoma overlying the right parietal and occipital regions. No underlying adrenal fracture. 3. No acute fracture or traumatic listhesis of the cervical spine. Critical Value/emergent results were called by telephone at the time of interpretation on 06/10/2022 at 1:55 pm to provider [facility] , who verbally acknowledged these results. Electronically Signed   By: Duanne Guess D.O.   On: 06/10/2022 13:56    Microbiology: Recent Results (from the past 240 hour(s))  MRSA Next Gen by PCR, Nasal     Status: None   Collection Time: 06/11/22 12:33 AM   Specimen: Nasal Mucosa; Nasal Swab  Result Value Ref Range Status   MRSA by PCR Next Gen NOT DETECTED NOT DETECTED Final    Comment: (NOTE) The GeneXpert MRSA Assay (FDA approved for NASAL specimens only), is one component of a comprehensive MRSA colonization surveillance program. It is not intended to diagnose MRSA infection  nor to guide or monitor treatment for MRSA infections. Test performance is not FDA approved in patients less than 1 years old. Performed at [facility], [address] [place], [place]  [address]      Labs: CBC: Recent Labs  Lab 06/11/22 0405 06/12/22 0547 06/13/22 0459 06/14/22 0602 06/15/22 0514  WBC 11.3* 9.4 8.7 7.9 7.8  NEUTROABS  --  6.4  --   --   --   HGB 12.2* 12.0* 11.0* 11.4* 11.5*  HCT 37.3* 37.4* 34.2* 35.0* 34.6*  MCV 99.5 101.1* 101.5* 100.0 97.7  PLT 186 169 162 183 207   Basic Metabolic Panel: Recent Labs  Lab 06/11/22 0405 06/12/22 0547 06/13/22 0459 06/14/22 0602 06/15/22 0514  NA 140 141 142 139 140  K 3.5 3.7 3.5 3.6 3.1*  CL 105 105 104 103 102  CO2 29 28 30 28 30   GLUCOSE 127* 164* 171* 207* 171*  BUN 18 13 15 18 15   CREATININE 0.58* 0.52* 0.58* 0.58* 0.56*  CALCIUM 8.4* 8.4* 8.1* 8.5* 8.5*  MG 1.7 2.0 1.7 1.8 1.6*  PHOS 3.5 2.4* 2.9 2.6 2.4*   Liver Function Tests: Recent Labs  Lab 06/10/22 1441  AST 43*  ALT 30  ALKPHOS 47  BILITOT 1.2  PROT 6.2*  ALBUMIN 3.2*   Recent Labs  Lab 06/11/22 0401  LIPASE 18   No results for input(s): "AMMONIA" in the last 168 hours. Cardiac Enzymes: No results for input(s): "CKTOTAL", "CKMB", "CKMBINDEX", "TROPONINI" in the last 168 hours. BNP (last 3 results) No results for input(s): "BNP" in the last 8760 hours. CBG: Recent Labs  Lab 06/15/22 2055 06/16/22 0032 06/16/22 0407 06/16/22 0816 06/16/22 1137  GLUCAP 227* 194* 128* 177* 214*    Time spent: 35 minutes  Signed:  Gillis Santa  [facility]  06/16/2022 12:01 PM

## 2022-06-16 NOTE — TOC Transition Note (Signed)
Transition of Care [facility]) - CM/SW Discharge Note   Patient Details  Name: Philip Carpenter. MRN: [id number] Date of Birth: Sep 16, 1945  [facility] CM/SW Contact:  Allena Katz, LCSW Phone Number: 06/16/2022, 1:13 PM   Clinical Narrative:    Pt discharging home with [facility]. [facility] to be called by hospice. Medical necessity printed to unit. No DME needs per hospice. CSW signing off.    Final next level of care: Skilled Nursing Facility Barriers to Discharge: Continued Medical Work up   Patient Goals and CMS Choice      Discharge Placement                         Discharge Plan and Services Additional resources added to the After Visit Summary for       Post Acute Care Choice:  (TBD)                               Social Determinants of Health (SDOH) Interventions SDOH Screenings   Food Insecurity: No Food Insecurity (03/30/2022)  Housing: Low Risk  (03/30/2022)  Transportation Needs: No Transportation Needs (03/30/2022)  Utilities: Not At Risk (03/30/2022)  Tobacco Use: Low Risk  (06/10/2022)     Readmission Risk Interventions     No data to display

## 2022-06-19 NOTE — Telephone Encounter (Signed)
Patient was discharged from the hospital to his home with Hospice going out to his home. Per Gigi Gin his wife it's very difficult to get patient out. Is the CT scan necessary?

## 2022-06-19 NOTE — Telephone Encounter (Signed)
Patient made aware to disregard f/u with our office via answering machine.

## 2022-06-19 NOTE — Telephone Encounter (Signed)
Recommendations for CT and clinic follow up were written before they decided to go home with hospice. Given the change in his status, they can disregard.

## 2022-06-29 ENCOUNTER — Ambulatory Visit: Payer: Medicare HMO | Admitting: Urology

## 2022-07-29 DEATH — deceased
